# Patient Record
Sex: Male | Born: 1960 | Race: White | Hispanic: No | Marital: Married | Smoking: Former smoker
Health system: Southern US, Community
[De-identification: ages and names within clinical notes are randomized; demographics above are authoritative.]

## PROBLEM LIST (undated history)

## (undated) DIAGNOSIS — Z9289 Personal history of other medical treatment: Secondary | ICD-10-CM

## (undated) DIAGNOSIS — E7800 Pure hypercholesterolemia, unspecified: Secondary | ICD-10-CM

## (undated) DIAGNOSIS — I2510 Atherosclerotic heart disease of native coronary artery without angina pectoris: Secondary | ICD-10-CM

## (undated) DIAGNOSIS — I10 Essential (primary) hypertension: Secondary | ICD-10-CM

## (undated) HISTORY — DX: Personal history of other medical treatment: Z92.89

## (undated) HISTORY — DX: Atherosclerotic heart disease of native coronary artery without angina pectoris: I25.10

## (undated) HISTORY — PX: HERNIA REPAIR: SHX51

---

## 2010-11-22 LAB — BASIC METABOLIC PANEL
BUN: 12 mg/dL (ref 6–23)
Chloride: 101 mEq/L (ref 96–112)
Potassium: 4.6 mEq/L (ref 3.5–5.1)
Sodium: 141 mEq/L (ref 135–145)

## 2010-11-22 LAB — DIFFERENTIAL
Basophils Absolute: 0 10*3/uL (ref 0.0–0.1)
Eosinophils Absolute: 0.1 10*3/uL (ref 0.0–0.7)
Lymphocytes Relative: 27 % (ref 12–46)
Lymphs Abs: 1.9 10*3/uL (ref 0.7–4.0)
Neutrophils Relative %: 65 % (ref 43–77)

## 2010-11-22 LAB — CBC
HCT: 44.7 % (ref 39.0–52.0)
MCV: 91.8 fL (ref 78.0–100.0)
Platelets: 225 10*3/uL (ref 150–400)
RBC: 4.87 MIL/uL (ref 4.22–5.81)
WBC: 7 10*3/uL (ref 4.0–10.5)

## 2010-11-22 LAB — SURGICAL PCR SCREEN: Staphylococcus aureus: NEGATIVE

## 2010-11-30 ENCOUNTER — Ambulatory Visit (HOSPITAL_COMMUNITY)
Admission: RE | Admit: 2010-11-30 | Discharge: 2010-11-30 | Disposition: A | Discharge status: Home / Self Care | Payer: BC Managed Care – PPO | Attending: General Surgery | Admitting: General Surgery

## 2010-11-30 DIAGNOSIS — K4020 Bilateral inguinal hernia, without obstruction or gangrene, not specified as recurrent: Secondary | ICD-10-CM | POA: Insufficient documentation

## 2010-12-18 NOTE — Op Note (Signed)
Donald Torres, Donald Torres               ACCOUNT NO.:  [id number]  MEDICAL RECORD NO.:  [id number]           PATIENT TYPE:  O  LOCATION:  DAYL                         FACILITY:  [facility]  PHYSICIAN:  Mary Sella. Andrey Campanile, MD     DATE OF BIRTH:  1960/12/17  DATE OF PROCEDURE:  11/30/2010 DATE OF DISCHARGE:                              OPERATIVE REPORT   PREOPERATIVE DIAGNOSIS:  Bilateral inguinal hernias.  POSTOPERATIVE DIAGNOSIS:  Bilateral direct inguinal hernias.  PROCEDURE:  Laparoscopic repair of bilateral direct inguinal hernias with mesh (transabdominal pre-peritoneal).  SURGEON:  Mary Sella.  Andrey Campanile, M.D.  ASSISTANT SURGEON:  None.  ANESTHESIA:  General plus 30 cc of 0.25% Marcaine with epinephrine.  FINDINGS:  The patient had bilateral direct inguinal hernias.  I used two pieces of Physiomesh approximately 10 cm x 15 cm, one for each groin, to repair the defect.  ESTIMATED BLOOD LOSS:  Minimal.  INDICATIONS FOR PROCEDURE:  The patient is a very pleasant 50 year old Caucasian male that has noticed bulges in his groin for some years. However, over the past 6 months they have gotten a little bit larger. He is also now having some intermittent discomfort depending on what activity he is doing.  He reports a dull ache in his groin and occasionally will have a sharp tightness in his groin.  He has elected to have repair.  We discussed the risks and benefits of surgery including bleeding, infection, injury to surrounding structures, testicular loss, chronic inguinal pain, need to convert to an open procedure, hernia recurrence, DVT occurrence and urinary retention.  We also talked about the typical postoperative recovery course.  All of his questions were asked and answered and he elected to proceed to surgery.  DESCRIPTION OF PROCEDURE:  After obtaining informed consent, the patient was brought back to the operating room and placed supine on the operating room table.  General  endotracheal anesthesia was established. Sequential compression devices were placed.  The Foley catheter was placed under sterile conditions.  His abdomen was prepped and draped in the usual standard surgical fashion.  Local was infiltrated at the base of the umbilicus.  Next, a 1-inch infraumbilical vertical incision was made with a #11 blade.  The fascia was grasped x2 and lifted anteriorly. Next, the fascia was incised with a #11 blade and the abdominal cavity was entered.  A pursestring suture consisting of 0 Vicryl on a UR6 needle was placed around the fascial edges.  The Hasson trocar was then placed under direct visualization into the abdominal cavity. Pneumoperitoneum was smoothly established up to a patient pressure of 15 mmHg.  The laparoscope was advanced.  I then placed him in Trendelenburg position.  Both groins were inspected.  There was no defect lateral to the inferior epigastric vessels.  However, he did have defects medial to both inferior epigastric vessels resulting in direct bilateral inguinal hernias.  I placed two 5-mm trocars, one in the left and one in the right midclavicular line, slightly above the level of the umbilicus under direct visualization after a local had been infiltrated.  I started my repair on the right side  first.  I grasped the peritoneum and then made a lazy S incision with electrocautery, taking care not to injure the inferior epigastric vessels.  I then gently pulled the peritoneal flap downward toward the patient's back.  Medially I identified the pubic tubercle.  I then continued to take down the flap. The inferior epigastric vessel location was again confirmed.  I then reduced the direct defect in its entirety.  I then stripped and created a large pocket by retracting the peritoneal flap backward inferior and superior.  I identified the vas deferens as well as the testicular vessels.  I made a large pocket.  At this point, I obtained a piece  of Physiomesh 10 cm x 14 cm, rolled it like a cigar and placed it through the Hasson trocar and rolled it into the right groin.  Prior to tacking the mesh into place, I grasped some of the peritoneum in the direct defect and tacked it into Cooper's ligament to help reduce the formation of seroma.  I then laid the mesh out so that it covered both the direct defect as well as lateral to the inferior epigastric vessels.  Both sites were well covered.  I then tacked the mesh to the Cooper's ligament with a secure strap tacker.  It was then tacked anterior and superior to the abdominal wall using one finger on the outside of the abdominal cavity as counter pressure.  One tack was placed on each side of the inferior epigastric vessels and one tack laterally.  The camera was then placed on the left 5-mm trocar and then I performed the same procedure on the left side.  Again a lazy S incision was made with the Endoshears starting laterally and going medially towards the median umbilical ligament.  I then dissected the flap downward creating a nice pocket.  Again, he did not have a defect in the indirect space but there was a little bit of give in that area but there was no hernia sac on that side.  He again had a large defect on the left.  I reduced the direct defect in its entirety.  I then created a nice pocket again to accommodate the piece of Physiomesh.  The inferior epigastric vessels, the vas deferens as well as the testicular vessels were again identified and confirmed.  A large Physiomesh was rolled like a cigar and placed into the Hasson trocar and then funneled into the left groin.  It was uncurled and laid flush against the abdominal wall, covering both the direct defect as well as lateral to the inferior epigastric vessels. This was again secured in a similar fashion.  Again, I tacked the peritoneum of the direct defect to Cooper's ligament to try to reduce the chance of seroma  formation.  The mesh was secured to the abdominal wall in a similar fashion that it had been on the right.  No tacks were placed below the Cooper's ligament or below the shelving edge.  The mesh was well seated.  I then reduced the intra-abdominal pressure to 8 mmHg. I then tacked the peritoneal flap back up to the anterior abdominal wall, again taking great care as to not place a tack through the inferior epigastric vessels.  Both pieces of mesh were well covered. There was no exposed mesh.  I then removed the Hasson trocar.  I tied down the previously placed pursestring suture.  There was a small gap. Therefore, I placed a single interrupted 0 Vicryl stitch again.  The fascial defect of the umbilicus was now well approximated.  There was no signs of air leak.  Pneumoperitoneum was released and the skin incisions were closed with 4-0 Monocryl in the subcuticular fashion followed by the application of Dermabond.  The Foley catheter was removed.  The patient's was extubated and taken to the recovery room in stable condition.  There were no immediate complications.  All needle, instrument and sponge counts were correct x2.  The patient tolerated the procedure well.     Mary Sella. Andrey Campanile, MD     EMW/MEDQ  D:  11/30/2010  T:  11/30/2010  Job:  [id number]  cc:   Dario Guardian, M.D. Fax: [phone]  Electronically Signed by Gaynelle Adu M.D. on 12/18/2010 08:25:44 AM

## 2014-07-23 ENCOUNTER — Inpatient Hospital Stay (HOSPITAL_COMMUNITY)
Admission: EM | Admit: 2014-07-23 | Discharge: 2014-07-27 | DRG: 247 | Disposition: A | Discharge status: Home / Self Care | Payer: BC Managed Care – PPO | Attending: Cardiovascular Disease | Admitting: Cardiovascular Disease

## 2014-07-23 ENCOUNTER — Encounter (HOSPITAL_COMMUNITY): Payer: Self-pay | Admitting: Emergency Medicine

## 2014-07-23 ENCOUNTER — Emergency Department (HOSPITAL_COMMUNITY): Payer: BC Managed Care – PPO

## 2014-07-23 DIAGNOSIS — I2510 Atherosclerotic heart disease of native coronary artery without angina pectoris: Secondary | ICD-10-CM | POA: Diagnosis present

## 2014-07-23 DIAGNOSIS — Z9861 Coronary angioplasty status: Secondary | ICD-10-CM

## 2014-07-23 DIAGNOSIS — Z79899 Other long term (current) drug therapy: Secondary | ICD-10-CM

## 2014-07-23 DIAGNOSIS — E7800 Pure hypercholesterolemia, unspecified: Secondary | ICD-10-CM | POA: Diagnosis present

## 2014-07-23 DIAGNOSIS — Z7982 Long term (current) use of aspirin: Secondary | ICD-10-CM

## 2014-07-23 DIAGNOSIS — I10 Essential (primary) hypertension: Secondary | ICD-10-CM

## 2014-07-23 DIAGNOSIS — R079 Chest pain, unspecified: Secondary | ICD-10-CM

## 2014-07-23 DIAGNOSIS — Z720 Tobacco use: Secondary | ICD-10-CM

## 2014-07-23 DIAGNOSIS — F17200 Nicotine dependence, unspecified, uncomplicated: Secondary | ICD-10-CM | POA: Diagnosis present

## 2014-07-23 DIAGNOSIS — I214 Non-ST elevation (NSTEMI) myocardial infarction: Secondary | ICD-10-CM | POA: Diagnosis not present

## 2014-07-23 DIAGNOSIS — E785 Hyperlipidemia, unspecified: Secondary | ICD-10-CM | POA: Diagnosis present

## 2014-07-23 DIAGNOSIS — Z7902 Long term (current) use of antithrombotics/antiplatelets: Secondary | ICD-10-CM

## 2014-07-23 HISTORY — DX: Pure hypercholesterolemia, unspecified: E78.00

## 2014-07-23 HISTORY — DX: Essential (primary) hypertension: I10

## 2014-07-23 LAB — BASIC METABOLIC PANEL
Anion gap: 13 (ref 5–15)
BUN: 10 mg/dL (ref 6–23)
CALCIUM: 9.5 mg/dL (ref 8.4–10.5)
CO2: 25 mEq/L (ref 19–32)
CREATININE: 0.79 mg/dL (ref 0.50–1.35)
Chloride: 101 mEq/L (ref 96–112)
Glucose, Bld: 94 mg/dL (ref 70–99)
Potassium: 3.9 mEq/L (ref 3.7–5.3)
Sodium: 139 mEq/L (ref 137–147)

## 2014-07-23 LAB — TROPONIN I: Troponin I: 1.08 ng/mL (ref <0.30)

## 2014-07-23 LAB — CBC
HEMATOCRIT: 44.1 % (ref 39.0–52.0)
Hemoglobin: 15.4 g/dL (ref 13.0–17.0)
MCH: 31.5 pg (ref 26.0–34.0)
MCHC: 34.9 g/dL (ref 30.0–36.0)
MCV: 90.2 fL (ref 78.0–100.0)
Platelets: 240 10*3/uL (ref 150–400)
RBC: 4.89 MIL/uL (ref 4.22–5.81)
RDW: 13.4 % (ref 11.5–15.5)
WBC: 8.4 10*3/uL (ref 4.0–10.5)

## 2014-07-23 LAB — I-STAT TROPONIN, ED: Troponin i, poc: 0.58 ng/mL (ref 0.00–0.08)

## 2014-07-23 MED ORDER — ENOXAPARIN SODIUM 100 MG/ML ~~LOC~~ SOLN
90.0000 mg | Freq: Two times a day (BID) | SUBCUTANEOUS | Status: DC
  Administered 2014-07-23: 90 mg via SUBCUTANEOUS
  Filled 2014-07-23 (×5): qty 1

## 2014-07-23 MED ORDER — NITROGLYCERIN 0.4 MG/SPRAY TL SOLN: 1.0000 | Status: DC | PRN

## 2014-07-23 MED ORDER — OMEGA-3-ACID ETHYL ESTERS 1 G PO CAPS
1.0000 g | ORAL_CAPSULE | Freq: Every day | ORAL | Status: DC
Start: 2014-07-24 — End: 2014-07-27
  Administered 2014-07-24 – 2014-07-27 (×4): 1 g via ORAL
  Filled 2014-07-23 (×4): qty 1

## 2014-07-23 MED ORDER — METOPROLOL TARTRATE 12.5 MG HALF TABLET
12.5000 mg | ORAL_TABLET | Freq: Two times a day (BID) | ORAL | Status: DC
  Administered 2014-07-23 – 2014-07-26 (×5): 12.5 mg via ORAL
  Filled 2014-07-23 (×7): qty 1

## 2014-07-23 MED ORDER — ACETAMINOPHEN 325 MG PO TABS: 650.0000 mg | ORAL_TABLET | ORAL | Status: DC | PRN

## 2014-07-23 MED ORDER — ASPIRIN EC 81 MG PO TBEC
81.0000 mg | DELAYED_RELEASE_TABLET | Freq: Every day | ORAL | Status: DC
  Administered 2014-07-24 – 2014-07-25 (×2): 81 mg via ORAL
  Filled 2014-07-23 (×2): qty 1

## 2014-07-23 MED ORDER — IRBESARTAN 150 MG PO TABS
150.0000 mg | ORAL_TABLET | Freq: Every day | ORAL | Status: DC
  Administered 2014-07-24 – 2014-07-27 (×4): 150 mg via ORAL
  Filled 2014-07-23 (×4): qty 1

## 2014-07-23 MED ORDER — NITROGLYCERIN 0.4 MG SL SUBL: 0.4000 mg | SUBLINGUAL_TABLET | SUBLINGUAL | Status: DC | PRN

## 2014-07-23 MED ORDER — SODIUM CHLORIDE 0.9 % IV SOLN
INTRAVENOUS | Status: AC
  Administered 2014-07-23: 23:00:00 via INTRAVENOUS

## 2014-07-23 MED ORDER — NITROGLYCERIN 0.4 MG SL SUBL
0.4000 mg | SUBLINGUAL_TABLET | SUBLINGUAL | Status: DC | PRN
  Administered 2014-07-23 (×3): 0.4 mg via SUBLINGUAL
  Filled 2014-07-23: qty 1

## 2014-07-23 MED ORDER — ATORVASTATIN CALCIUM 80 MG PO TABS
80.0000 mg | ORAL_TABLET | Freq: Every day | ORAL | Status: DC
  Administered 2014-07-24 – 2014-07-25 (×2): 80 mg via ORAL
  Filled 2014-07-23 (×4): qty 1

## 2014-07-23 MED ORDER — ONDANSETRON HCL 4 MG/2ML IJ SOLN: 4.0000 mg | Freq: Four times a day (QID) | INTRAMUSCULAR | Status: DC | PRN

## 2014-07-23 NOTE — Progress Notes (Signed)
ANTICOAGULATION CONSULT NOTE - Initial Consult  Pharmacy Consult for lovenox Indication: chest pain/ACS  No Known Allergies  Patient Measurements: Height: 6' (182.9 cm) Weight: 195 lb (88.451 kg) IBW/kg (Calculated) : 77.6   Vital Signs: Temp: 97.6 F (36.4 C) (09/25 2053) Temp src: Oral (09/25 2053) BP: 123/64 mmHg (09/25 2053) Pulse Rate: 75 (09/25 2053)  Labs:  Recent Labs  07/23/14 1851  HGB 15.4  HCT 44.1  PLT 240  CREATININE 0.79  TROPONINI 1.08*    Estimated Creatinine Clearance: 119.9 ml/min (by C-G formula based on Cr of 0.79).   Medical History: Past Medical History  Diagnosis Date  . Hypertension   . High cholesterol     Assessment: 44 YOM with hx of HTN, and HLD presented with chest pain with troponin elevation. Pharmacy is consulted to start full dose lovenox for ACS. CBC wnl, no anticoagulation prior to admission. Scr 0.79, est. crcl > 100 ml/min  Goal of Therapy:  Anti-Xa level 0.6-1 units/ml 4hrs after LMWH dose given Monitor platelets by anticoagulation protocol: Yes   Plan:  - Lovenox 90 mg sq Q 12 hrs - Monitor cbc and renal function  Maryanna Shape, PharmD, BCPS  Clinical Pharmacist  Pager: [phone]   07/23/2014,8:59 PM

## 2014-07-23 NOTE — ED Notes (Signed)
EKG completed and given to ED Doctor at bedside.

## 2014-07-23 NOTE — ED Provider Notes (Signed)
I saw and evaluated the patient, reviewed the resident's note and I agree with the findings and plan.   .Face to face Exam:  General:  Awake HEENT:  Atraumatic Resp:  Normal effort Abd:  Nondistended Neuro:No focal weakness Lymph: No adenopathy  EKG was discussed and reviewed with the resident  CRITICAL CARE Performed by: Leonard Schwartz L Total critical care time: 30 min Critical care time was exclusive of separately billable procedures and treating other patients. Critical care was necessary to treat or prevent imminent or life-threatening deterioration. Critical care was time spent personally by me on the following activities: development of treatment plan with patient and/or surrogate as well as nursing, discussions with consultants, evaluation of patient's response to treatment, examination of patient, obtaining history from patient or surrogate, ordering and performing treatments and interventions, ordering and review of laboratory studies, ordering and review of radiographic studies, pulse oximetry and re-evaluation of patient's condition.   Dot Lanes, MD 07/23/14 2352

## 2014-07-23 NOTE — ED Notes (Signed)
Cardiology at bedside.

## 2014-07-23 NOTE — ED Notes (Signed)
Elevated i-stat trop shown to Dr. Sunny Schlein

## 2014-07-23 NOTE — ED Notes (Signed)
MD at bedside. 

## 2014-07-23 NOTE — ED Notes (Signed)
Lab called with critical value troponin 1.08. MD notified

## 2014-07-23 NOTE — ED Notes (Signed)
Had substernal aching cp this morning that has resolved. Now, chest feels tight.  Also, feels weak.

## 2014-07-23 NOTE — H&P (Signed)
Cardiology History and Physical  PCP: Mathews Argyle, MD Cardiologist: None  History of Present Illness (and review of medical records): Donald Torres is a 53 y.o. male who presents for evaluation of chest pain.  He was working on his car this am changing tires when he developed chest pain.  Pain was described as aching/pressure like sensation.  He rated pressure 5/10 and rated across chest and to both arms bilaterally.  This was around 10am.  He took his am meds and an ASA.  He called his wife who works in 46 office as pressure did not go away after resting for a few minutes. He has had chest pain before but was concerned since it never last long. He was seen around noon.  He had lab work and ekg done.  He still felt drained and mild pressure at this time.  He was called later on around 5pm with abnormal troponins.  He thus presented to the ED for further evaluation.  He was given 2 Nitro in the ED and pressure was 1/10.  His troponins are 0.58, 1.08.    Previous diagnostic testing for coronary artery disease includes: none. Previous history of cardiac disease includes None.  Coronary artery disease risk factors include: dyslipidemia, hypertension, male gender and smoking/ tobacco exposure.  Patient denies history of CABG, cardiomyopathy, coronary angioplasty, coronary artery stent, ischemic heart disease, pericarditis, previous M.I. and valvular disease.  Review of Systems Further review of systems was otherwise negative other than stated in HPI.  Patient Active Problem List   Diagnosis Date Noted  . NSTEMI (non-ST elevated myocardial infarction) 07/23/2014  . HTN (hypertension), benign 07/23/2014  . Dyslipidemia 07/23/2014  . Tobacco abuse 07/23/2014   Past Medical History  Diagnosis Date  . Hypertension   . High cholesterol     Past Surgical History  Procedure Laterality Date  . Hernia repair       (Not in a hospital admission) No Known Allergies  History   Substance Use Topics  . Smoking status: Current Every Day Smoker  . Smokeless tobacco: Not on file  . Alcohol Use: Yes    No family history on file.   Objective:  Patient Vitals for the past 8 hrs:  BP Temp Temp src Pulse Resp SpO2 Height Weight  07/23/14 2115 123/68 mmHg - - 78 14 96 % - -  07/23/14 2100 123/73 mmHg - - 76 14 97 % - -  07/23/14 2053 123/64 mmHg 97.6 F (36.4 C) Oral 75 18 98 % - -  07/23/14 2045 130/68 mmHg - - 78 11 98 % - -  07/23/14 2035 124/78 mmHg - - 81 16 95 % - -  07/23/14 2030 136/83 mmHg - - 66 14 97 % - -  07/23/14 2000 124/79 mmHg - - 77 16 96 % - -  07/23/14 1957 - - - 69 13 96 % - -  07/23/14 1945 136/78 mmHg - - 78 16 96 % - -  07/23/14 1928 138/82 mmHg - - 77 24 99 % - -  07/23/14 1846 139/74 mmHg 98.1 F (36.7 C) Oral 79 12 97 % 6' (1.829 m) 88.451 kg (195 lb)   General appearance: alert, cooperative, appears stated age and no distress Head: Normocephalic, without obvious abnormality, atraumatic Eyes: conjunctivae/corneas clear. PERRL, EOM's intact. Neck: no carotid bruit, no JVD and supple, Lungs: clear to auscultation bilaterally Chest wall: no tenderness Heart: regular rate and rhythm, S1, S2 normal, no murmur, click, rub or  gallop Abdomen: soft, non-tender; bowel sounds normal Extremities: extremities normal, atraumatic, no cyanosis or edema Pulses: 2+ and symmetric Neurologic: Grossly normal  Results for orders placed during the hospital encounter of 07/23/14 (from the past 48 hour(s))  CBC     Status: None   Collection Time    07/23/14  6:51 PM      Result Value Ref Range   WBC 8.4  4.0 - 10.5 K/uL   RBC 4.89  4.22 - 5.81 MIL/uL   Hemoglobin 15.4  13.0 - 17.0 g/dL   HCT 44.1  39.0 - 52.0 %   MCV 90.2  78.0 - 100.0 fL   MCH 31.5  26.0 - 34.0 pg   MCHC 34.9  30.0 - 36.0 g/dL   RDW 13.4  11.5 - 15.5 %   Platelets 240  150 - 400 K/uL  BASIC METABOLIC PANEL     Status: None   Collection Time    07/23/14  6:51 PM      Result  Value Ref Range   Sodium 139  137 - 147 mEq/L   Potassium 3.9  3.7 - 5.3 mEq/L   Chloride 101  96 - 112 mEq/L   CO2 25  19 - 32 mEq/L   Glucose, Bld 94  70 - 99 mg/dL   BUN 10  6 - 23 mg/dL   Creatinine, Ser 0.79  0.50 - 1.35 mg/dL   Calcium 9.5  8.4 - 10.5 mg/dL   GFR calc non Af Amer >90  >90 mL/min   GFR calc Af Amer >90  >90 mL/min   Comment: (NOTE)     The eGFR has been calculated using the CKD EPI equation.     This calculation has not been validated in all clinical situations.     eGFR's persistently <90 mL/min signify possible Chronic Kidney     Disease.   Anion gap 13  5 - 15  TROPONIN I     Status: Abnormal   Collection Time    07/23/14  6:51 PM      Result Value Ref Range   Troponin I 1.08 (*) <0.30 ng/mL   Comment:            Due to the release kinetics of cTnI,     a negative result within the first hours     of the onset of symptoms does not rule out     myocardial infarction with certainty.     If myocardial infarction is still suspected,     repeat the test at appropriate intervals.     CRITICAL RESULT CALLED TO, READ BACK BY AND VERIFIED WITH:     Loretta Plume [id number] 7/35/32 D BRADLEY  I-STAT TROPOININ, ED     Status: Abnormal   Collection Time    07/23/14  7:11 PM      Result Value Ref Range   Troponin i, poc 0.58 (*) 0.00 - 0.08 ng/mL   Comment NOTIFIED PHYSICIAN     Comment 3            Comment: Due to the release kinetics of cTnI,     a negative result within the first hours     of the onset of symptoms does not rule out     myocardial infarction with certainty.     If myocardial infarction is still suspected,     repeat the test at appropriate intervals.   Dg Chest 2 View  07/23/2014   CLINICAL DATA:  Shortness of breath and chest pain.  EXAM: CHEST  2 VIEW  COMPARISON:  11/21/2010  FINDINGS: Lungs are clear bilaterally. Heart and mediastinum are within normal limits. The trachea is midline. Negative for a pneumothorax. No acute bone abnormality.   IMPRESSION: No active cardiopulmonary disease.   Electronically Signed   By: Markus Daft M.D.   On: 07/23/2014 19:23    ECG:  9/25 1841 sinus rhythm, cannot exclude inferior infarct, no prior ecgs to compare.  9/25 2024 sinus rhythm, probably inferior infarct.  Assessment: 50M with hyx of HTN, dyslipidemia, tobacco abuse presents with chest pain, abnormal ecg and positive troponins suggestive of ACS/NSTEMI.  Plan: 1. Cardiology Admission  2. Continuous monitoring on Telemetry. 3. Repeat ekg on admit, prn chest pain or arrythmia 4. Trend cardiac biomarkers, check lipids, hgba1c, tsh 5. Medical management to include ASA, Lovenox Q12H, BB, ARB, Statin, NTG prn 6. TTE in am assess LV function, wall motion, and valve disorders 7. Gentle IVFs 8. Will likely need further invasive ischemic evaluation with cardiac catheterization.

## 2014-07-23 NOTE — ED Provider Notes (Signed)
CSN: [id number]     Arrival date & time 07/23/14  1837 History   First MD Initiated Contact with Patient 07/23/14 1917     Chief Complaint  Patient presents with  . Chest Pain  . Shortness of Breath     (Consider location/radiation/quality/duration/timing/severity/associated sxs/prior Treatment) HPI Comments: No hx of recent travel/surgery/hormones/trauma/hemoptysis/fam hx of PE/DVT   Patient is a 53 y.o. male presenting with chest pain. The history is provided by the patient.  Chest Pain Chest pain location: center chest. Pain quality: dull and pressure   Pain radiates to:  L arm and R arm Pain radiates to the back: no   Pain severity:  Mild Onset quality:  Gradual (states started just after fixing tires on his car) Duration:  8 hours Timing:  Constant Progression:  Partially resolved Chronicity:  New Context: at rest   Context comment:  Shortly after rotating tires on car Relieved by:  Aspirin (325 ASA helped) Worsened by:  Nothing tried Ineffective treatments:  None tried Associated symptoms: nausea and shortness of breath   Associated symptoms: no abdominal pain, no claudication, no cough, no diaphoresis, no dizziness, no fever, no lower extremity edema, no numbness, no orthopnea, no syncope, not vomiting and no weakness   Risk factors: high cholesterol, hypertension, male sex and smoking   Risk factors: no birth control, no coronary artery disease, no diabetes mellitus, no immobilization, not obese and no prior DVT/PE     Past Medical History  Diagnosis Date  . Hypertension   . High cholesterol    Past Surgical History  Procedure Laterality Date  . Hernia repair     History reviewed. No pertinent family history. History  Substance Use Topics  . Smoking status: Current Every Day Smoker -- 1.00 packs/day for 36 years  . Smokeless tobacco: Not on file  . Alcohol Use: Yes    Review of Systems  Constitutional: Negative for fever, diaphoresis, activity change and  appetite change.  HENT: Negative for congestion and rhinorrhea.   Eyes: Negative for discharge and itching.  Respiratory: Positive for shortness of breath. Negative for cough and wheezing.   Cardiovascular: Positive for chest pain. Negative for orthopnea, claudication and syncope.  Gastrointestinal: Positive for nausea. Negative for vomiting, abdominal pain, diarrhea and constipation.  Genitourinary: Negative for hematuria, decreased urine volume and difficulty urinating.  Skin: Negative for rash and wound.  Neurological: Negative for dizziness, syncope, weakness and numbness.  All other systems reviewed and are negative.     Allergies  Review of patient's allergies indicates no known allergies.  Home Medications   Prior to Admission medications   Medication Sig Start Date End Date Taking? Authorizing Provider  ALPRAZolam Duanne Moron) 0.5 MG tablet Take 1 tablet by mouth daily as needed. For anxiety 07/12/14  Yes Historical Provider, MD  amLODipine (NORVASC) 2.5 MG tablet Take 2.5 mg by mouth daily.   Yes Historical Provider, MD  aspirin EC 81 MG tablet Take 81 mg by mouth daily.   Yes Historical Provider, MD  NITROSTAT 0.4 MG SL tablet Place 1 tablet under the tongue every 5 (five) minutes x 3 doses as needed. For chest pain 07/23/14  Yes Historical Provider, MD  Omega-3 Fatty Acids (FISH OIL PO) Take 1 capsule by mouth daily.   Yes Historical Provider, MD  TRIBENZOR 40-5-12.5 MG TABS Take 1 tablet by mouth daily. 06/25/14  Yes Historical Provider, MD   BP 114/61  Pulse 81  Temp(Src) 98.2 F (36.8 C) (Oral)  Resp 14  Ht 6' (1.829 m)  Wt 195 lb (88.451 kg)  BMI 26.44 kg/m2  SpO2 96% Physical Exam  Vitals reviewed. Constitutional: He is oriented to person, place, and time. He appears well-developed and well-nourished. No distress.  HENT:  Head: Normocephalic and atraumatic.  Mouth/Throat: Oropharynx is clear and moist. No oropharyngeal exudate.  Eyes: Conjunctivae and EOM are  normal. Pupils are equal, round, and reactive to light. Right eye exhibits no discharge. Left eye exhibits no discharge. No scleral icterus.  Neck: Normal range of motion. Neck supple.  Cardiovascular: Normal rate, regular rhythm, normal heart sounds and intact distal pulses.  Exam reveals no gallop and no friction rub.   No murmur heard. Pulmonary/Chest: Effort normal and breath sounds normal. No respiratory distress. He has no wheezes. He has no rales.  Abdominal: Soft. He exhibits no distension and no mass. There is no tenderness.  Musculoskeletal: Normal range of motion. He exhibits no edema (no leg swelling or calf ttp).  Neurological: He is alert and oriented to person, place, and time. No cranial nerve deficit. He exhibits normal muscle tone. Coordination normal.  Skin: Skin is warm. No rash noted. He is not diaphoretic.    ED Course  Procedures (including critical care time) Labs Review Labs Reviewed  TROPONIN I - Abnormal; Notable for the following:    Troponin I 1.08 (*)    All other components within normal limits  I-STAT TROPOININ, ED - Abnormal; Notable for the following:    Troponin i, poc 0.58 (*)    All other components within normal limits  MRSA PCR SCREENING  CBC  BASIC METABOLIC PANEL  TROPONIN I  TROPONIN I  TROPONIN I  HEMOGLOBIN E9F  BASIC METABOLIC PANEL  LIPID PANEL  CBC  PROTIME-INR    Imaging Review Dg Chest 2 View  07/23/2014   CLINICAL DATA:  Shortness of breath and chest pain.  EXAM: CHEST  2 VIEW  COMPARISON:  11/21/2010  FINDINGS: Lungs are clear bilaterally. Heart and mediastinum are within normal limits. The trachea is midline. Negative for a pneumothorax. No acute bone abnormality.  IMPRESSION: No active cardiopulmonary disease.   Electronically Signed   By: Markus Daft M.D.   On: 07/23/2014 19:23     EKG Interpretation   Date/Time:  Friday July 23 2014 18:41:11 EDT Ventricular Rate:  87 PR Interval:  158 QRS Duration: 94 QT  Interval:  356 QTC Calculation: 428 R Axis:   48 Text Interpretation:  Normal sinus rhythm Normal ECG Confirmed by BEATON   MD, ROBERT ([id number]) on 07/23/2014 7:41:37 PM      MDM   MDM: 53 y.o. WM w/ PMHx of HTN, HLD, smoker, fam hx of ACS w/ cc: of chest pain. Occurred after fixing tires on car. SOB as well. Took ASA at home. Got better but still heavy. Here AFVSS. No PE risk factors outside of age, and not c/w PE. Pt well appearing. EKG with no ischemic chagne. Trop 1. Concern for ACS. NTG given. D/w cards who recommend Lovenox. Lovenox given. Admit to Cards. Pt remained HDS while in ED. Admit.  Final diagnoses:  NSTEMI (non-ST elevated myocardial infarction)    Admit to Cards   Sol Passer, MD 07/23/14 2322

## 2014-07-24 DIAGNOSIS — I517 Cardiomegaly: Secondary | ICD-10-CM

## 2014-07-24 LAB — CBC
HEMATOCRIT: 42.6 % (ref 39.0–52.0)
Hemoglobin: 14.3 g/dL (ref 13.0–17.0)
MCH: 31.4 pg (ref 26.0–34.0)
MCHC: 33.6 g/dL (ref 30.0–36.0)
MCV: 93.6 fL (ref 78.0–100.0)
Platelets: 207 10*3/uL (ref 150–400)
RBC: 4.55 MIL/uL (ref 4.22–5.81)
RDW: 13.6 % (ref 11.5–15.5)
WBC: 7.5 10*3/uL (ref 4.0–10.5)

## 2014-07-24 LAB — PROTIME-INR
INR: 1.08 (ref 0.00–1.49)
PROTHROMBIN TIME: 14 s (ref 11.6–15.2)

## 2014-07-24 LAB — TROPONIN I
TROPONIN I: 1.37 ng/mL — AB (ref <0.30)
Troponin I: 1.25 ng/mL (ref <0.30)
Troponin I: 0.56 ng/mL (ref <0.30)

## 2014-07-24 LAB — BASIC METABOLIC PANEL
Anion gap: 11 (ref 5–15)
BUN: 11 mg/dL (ref 6–23)
CO2: 28 meq/L (ref 19–32)
CREATININE: 0.98 mg/dL (ref 0.50–1.35)
Calcium: 9 mg/dL (ref 8.4–10.5)
Chloride: 103 mEq/L (ref 96–112)
GFR calc non Af Amer: >90 mL/min (ref >90)
Glucose, Bld: 97 mg/dL (ref 70–99)
POTASSIUM: 4.4 meq/L (ref 3.7–5.3)
Sodium: 142 mEq/L (ref 137–147)

## 2014-07-24 LAB — LIPID PANEL
CHOL/HDL RATIO: 6.6 ratio
Cholesterol: 211 mg/dL — ABNORMAL HIGH (ref 0–200)
HDL: 32 mg/dL — AB (ref >39)
LDL Cholesterol: 140 mg/dL — ABNORMAL HIGH (ref 0–99)
TRIGLYCERIDES: 196 mg/dL — AB (ref <150)
VLDL: 39 mg/dL (ref 0–40)

## 2014-07-24 LAB — HEMOGLOBIN A1C
HEMOGLOBIN A1C: 6.3 % — AB (ref <5.7)
Mean Plasma Glucose: 134 mg/dL — ABNORMAL HIGH (ref <117)

## 2014-07-24 LAB — MRSA PCR SCREENING: MRSA by PCR: NEGATIVE

## 2014-07-24 MED ORDER — HEPARIN (PORCINE) IN NACL 100-0.45 UNIT/ML-% IJ SOLN
1000.0000 [IU]/h | INTRAMUSCULAR | Status: DC
  Administered 2014-07-24: 1000 [IU]/h via INTRAVENOUS
  Filled 2014-07-24 (×2): qty 250

## 2014-07-24 MED ORDER — ENOXAPARIN SODIUM 100 MG/ML ~~LOC~~ SOLN
85.0000 mg | Freq: Two times a day (BID) | SUBCUTANEOUS | Status: DC
  Administered 2014-07-24: 85 mg via SUBCUTANEOUS
  Filled 2014-07-24 (×3): qty 1

## 2014-07-24 NOTE — Progress Notes (Signed)
Subjective:  Events of night reviewed. No chest pain since admission.   Objective:  Vital Signs in the last 24 hours: BP 132/68  Pulse 84  Temp(Src) 98 F (36.7 C) (Oral)  Resp 15  Ht 6' (1.829 m)  Wt 85.4 kg (188 lb 4.4 oz)  BMI 25.53 kg/m2  SpO2 96%  Physical Exam: Pleasant WM in NAD Lungs:  Clear Cardiac:  Regular rhythm, normal S1 and S2, no S3 Extremities:  No edema present  Intake/Output from previous day: 09/25 0701 - 09/26 0700 In: 665 [P.O.:200; I.V.:465] Out: -   Weight Filed Weights   07/23/14 1846 07/23/14 2300  Weight: 88.451 kg (195 lb) 85.4 kg (188 lb 4.4 oz)    Lab Results: Basic Metabolic Panel:  Recent Labs  07/23/14 1851 07/24/14 0420  NA 139 142  K 3.9 4.4  CL 101 103  CO2 25 28  GLUCOSE 94 97  BUN 10 11  CREATININE 0.79 0.98   CBC:  Recent Labs  07/23/14 1851 07/24/14 0420  WBC 8.4 7.5  HGB 15.4 14.3  HCT 44.1 42.6  MCV 90.2 93.6  PLT 240 207   Cardiac Enzymes:  Recent Labs  07/23/14 2324 07/24/14 0420 07/24/14 1015  TROPONINI 1.37* 1.25* 0.56*    Telemetry: sinus  Assessment/Plan:  1. Non STEMI clinically 2. Hypertension 3. Tobacco abuse advised to stop  Plan:  IV heparin. Repeat EKG.  Will need cardiac cath Monday.  Sooner if becomes unstable.   Kerry Hough  MD [facility]  07/24/2014, 12:46 PM

## 2014-07-24 NOTE — Progress Notes (Signed)
  Echocardiogram 2D Echocardiogram has been performed.  Donald Torres 07/24/2014, 10:19 AM

## 2014-07-24 NOTE — Progress Notes (Signed)
ANTICOAGULATION CONSULT NOTE - Follow Up Consult  Pharmacy Consult for Lovenox >> Heparin Indication: chest pain/ACS  No Known Allergies  Patient Measurements: Height: 6' (182.9 cm) Weight: 188 lb 4.4 oz (85.4 kg) IBW/kg (Calculated) : 77.6 Heparin Dosing Weight: 85kg  Vital Signs: Temp: 98 F (36.7 C) (09/26 1206) Temp src: Oral (09/26 1206) BP: 132/68 mmHg (09/26 1206)  Labs:  Recent Labs  07/23/14 1851 07/23/14 2324 07/24/14 0420 07/24/14 1015  HGB 15.4  --  14.3  --   HCT 44.1  --  42.6  --   PLT 240  --  207  --   LABPROT  --   --  14.0  --   INR  --   --  1.08  --   CREATININE 0.79  --  0.98  --   TROPONINI 1.08* 1.37* 1.25* 0.56*    Estimated Creatinine Clearance: 97.9 ml/min (by C-G formula based on Cr of 0.98).  Assessment: 53yom started on full dose lovenox yesterday for chest pain (last dose 85mg  @ 0911 this morning). He will now be changed to IV heparin with plans for cath on Monday. Will delay heparin start until 2100 when next lovenox would have been due.  Goal of Therapy:  Heparin level 0.3-0.7 units/ml Monitor platelets by anticoagulation protocol: Yes   Plan:  1) D/C lovenox 2) At 2100, start heparin at 1000 units/hr 3) Check 6 hour heparin level 4) Daily heparin level and CBC  Deboraha Sprang 07/24/2014,2:59 PM

## 2014-07-25 DIAGNOSIS — I214 Non-ST elevation (NSTEMI) myocardial infarction: Secondary | ICD-10-CM

## 2014-07-25 LAB — CBC
HEMATOCRIT: 42.7 % (ref 39.0–52.0)
HEMOGLOBIN: 14.5 g/dL (ref 13.0–17.0)
MCH: 30.9 pg (ref 26.0–34.0)
MCHC: 34 g/dL (ref 30.0–36.0)
MCV: 91 fL (ref 78.0–100.0)
Platelets: 197 10*3/uL (ref 150–400)
RBC: 4.69 MIL/uL (ref 4.22–5.81)
RDW: 13.5 % (ref 11.5–15.5)
WBC: 8.5 10*3/uL (ref 4.0–10.5)

## 2014-07-25 LAB — HEPARIN LEVEL (UNFRACTIONATED)
Heparin Unfractionated: 0.36 IU/mL (ref 0.30–0.70)
Heparin Unfractionated: 0.12 IU/mL — ABNORMAL LOW (ref 0.30–0.70)
Heparin Unfractionated: 0.34 IU/mL (ref 0.30–0.70)

## 2014-07-25 MED ORDER — HEPARIN (PORCINE) IN NACL 100-0.45 UNIT/ML-% IJ SOLN
1300.0000 [IU]/h | INTRAMUSCULAR | Status: DC
  Administered 2014-07-25 – 2014-07-26 (×2): 1300 [IU]/h via INTRAVENOUS
  Filled 2014-07-25 (×4): qty 250

## 2014-07-25 MED ORDER — SODIUM CHLORIDE 0.9 % IJ SOLN
3.0000 mL | Freq: Two times a day (BID) | INTRAMUSCULAR | Status: DC
Start: 2014-07-25 — End: 2014-07-26
  Administered 2014-07-25: 3 mL via INTRAVENOUS

## 2014-07-25 MED ORDER — SODIUM CHLORIDE 0.9 % IV SOLN
1.0000 mL/kg/h | INTRAVENOUS | Status: DC
  Administered 2014-07-26: 1 mL/kg/h via INTRAVENOUS

## 2014-07-25 MED ORDER — ASPIRIN EC 81 MG PO TBEC: 81.0000 mg | DELAYED_RELEASE_TABLET | Freq: Every day | ORAL | Status: DC

## 2014-07-25 MED ORDER — SODIUM CHLORIDE 0.9 % IJ SOLN: 3.0000 mL | INTRAMUSCULAR | Status: DC | PRN

## 2014-07-25 MED ORDER — SODIUM CHLORIDE 0.9 % IV SOLN: 250.0000 mL | INTRAVENOUS | Status: DC | PRN

## 2014-07-25 MED ORDER — ASPIRIN 81 MG PO CHEW
81.0000 mg | CHEWABLE_TABLET | ORAL | Status: AC
  Administered 2014-07-26: 81 mg via ORAL
  Filled 2014-07-25: qty 1

## 2014-07-25 NOTE — Progress Notes (Signed)
Utilization Review Completed.   Kynesha Guerin, RN, BSN Nurse Case Manager  

## 2014-07-25 NOTE — Progress Notes (Signed)
       Patient Name: Donald Torres      SUBJECTIVE:no recurrent chest pain Are you a smoker " I was "  Donald Torres   Past Medical History  Diagnosis Date  . Hypertension   . High cholesterol     Scheduled Meds:  Scheduled Meds: . aspirin EC  81 mg Oral Daily  . atorvastatin  80 mg Oral q1800  . irbesartan  150 mg Oral Daily  . metoprolol tartrate  12.5 mg Oral BID  . omega-3 acid ethyl esters  1 g Oral Daily   Continuous Infusions: . heparin 1,000 Units/hr (07/25/14 0900)   acetaminophen, nitroGLYCERIN, ondansetron (ZOFRAN) IV    PHYSICAL EXAM Filed Vitals:   07/25/14 0500 07/25/14 0600 07/25/14 0700 07/25/14 0807  BP:    113/70  Pulse:    52  Temp:    97.6 F (36.4 C)  TempSrc:    Oral  Resp: 13 13 11 11   Height:      Weight:      SpO2:    96%   Well developed and nourished in no acute distress HENT normal Neck supple with JVP-flat Clear Regular rate and rhythm, no murmurs or gallops Abd-soft with active BS No Clubbing cyanosis edema Skin-warm and dry A & Oriented  Grossly normal sensory and motor function  TELEMETRY: Reviewed telemetry pt in nsr    Intake/Output Summary (Last 24 hours) at 07/25/14 1012 Last data filed at 07/25/14 0900  Gross per 24 hour  Intake 1257.5 ml  Output      0 ml  Net 1257.5 ml    LABS: Basic Metabolic Panel:  Recent Labs Lab 07/23/14 1851 07/24/14 0420  NA 139 142  K 3.9 4.4  CL 101 103  CO2 25 28  GLUCOSE 94 97  BUN 10 11  CREATININE 0.79 0.98  CALCIUM 9.5 9.0   Cardiac Enzymes:  Recent Labs  07/23/14 2324 07/24/14 0420 07/24/14 1015  TROPONINI 1.37* 1.25* 0.56*   CBC:  Recent Labs Lab 07/23/14 1851 07/24/14 0420 07/25/14 0256  WBC 8.4 7.5 8.5  HGB 15.4 14.3 14.5  HCT 44.1 42.6 42.7  MCV 90.2 93.6 91.0  PLT 240 207 197   PROTIME:  Recent Labs  07/24/14 0420  LABPROT 14.0  INR 1.08   Liver Function Tests: No results found for this basename: AST, ALT, ALKPHOS, BILITOT, PROT,  ALBUMIN,  in the last 72 hours No results found for this basename: LIPASE, AMYLASE,  in the last 72 hours BNP: BNP (last 3 results) No results found for this basename: PROBNP,  in the last 8760 hours D-Dimer: No results found for this basename: DDIMER,  in the last 72 hours Hemoglobin A1C:  Recent Labs  07/23/14 2324  HGBA1C 6.3*   Fasting Lipid Panel:  Recent Labs  07/24/14 0420  CHOL 211*  HDL 32*  LDLCALC 140*  TRIG 196*  CHOLHDL 6.6   Thyroid Function Tests:  ASSESSMENT AND PLAN:  Principal Problem:   NSTEMI (non-ST elevated myocardial infarction) Active Problems:   HTN (hypertension), benign   Dyslipidemia   Tobacco abuse  On IV heparin on for cath monday   Signed, Virl Axe MD  07/25/2014

## 2014-07-25 NOTE — Progress Notes (Signed)
ANTICOAGULATION CONSULT NOTE - Follow Up Consult  Pharmacy Consult for Heparin Indication: chest pain/ACS  No Known Allergies  Patient Measurements: Height: 6' (182.9 cm) Weight: 188 lb 4.4 oz (85.4 kg) IBW/kg (Calculated) : 77.6 Heparin Dosing Weight: 85kg  Vital Signs: Temp: 97.6 F (36.4 C) (09/27 0807) Temp src: Oral (09/27 0807) BP: 113/70 mmHg (09/27 0807) Pulse Rate: 52 (09/27 0807)  Labs:  Recent Labs  07/23/14 1851 07/23/14 2324 07/24/14 0420 07/24/14 1015 07/25/14 0256 07/25/14 0937  HGB 15.4  --  14.3  --  14.5  --   HCT 44.1  --  42.6  --  42.7  --   PLT 240  --  207  --  197  --   LABPROT  --   --  14.0  --   --   --   INR  --   --  1.08  --   --   --   HEPARINUNFRC  --   --   --   --  0.36 0.12*  CREATININE 0.79  --  0.98  --   --   --   TROPONINI 1.08* 1.37* 1.25* 0.56*  --   --     Estimated Creatinine Clearance: 97.9 ml/min (by C-G formula based on Cr of 0.98).  Medications: Heparin @ 1000 units/hr  Assessment: 53yom switched from full dose lovenox to IV heparin yesterday with plans for cath tomorrow. Initial heparin level was therapeutic, but likely reflected the lovenox that had been given yesterday morning. Follow up heparin level is below goal. CBC is stable. No bleeding reported.  Goal of Therapy:  Heparin level 0.3-0.7 units/ml Monitor platelets by anticoagulation protocol: Yes   Plan:  1) Increase heparin to 1300 units/hr 2) Check 6 hour heparin level  Deboraha Sprang 07/25/2014,10:52 AM

## 2014-07-25 NOTE — Progress Notes (Signed)
ANTICOAGULATION CONSULT NOTE - Follow Up Consult  Pharmacy Consult for heparin Indication: NSTEMI  Labs:  Recent Labs  07/23/14 1851 07/23/14 2324 07/24/14 0420 07/24/14 1015 07/25/14 0256  HGB 15.4  --  14.3  --  14.5  HCT 44.1  --  42.6  --  42.7  PLT 240  --  207  --  197  LABPROT  --   --  14.0  --   --   INR  --   --  1.08  --   --   HEPARINUNFRC  --   --   --   --  0.36  CREATININE 0.79  --  0.98  --   --   TROPONINI 1.08* 1.37* 1.25* 0.56*  --     Assessment/Plan:  53yo male therapeutic on heparin with initial dosing after transitioned from Lovenox. Will continue gtt at current rate and confirm stable with additional level.   Wynona Neat, PharmD, BCPS  07/25/2014,3:24 AM

## 2014-07-25 NOTE — Progress Notes (Signed)
ANTICOAGULATION CONSULT NOTE - Follow Up Consult  Pharmacy Consult for heparin Indication: chest pain/ACS  No Known Allergies  Patient Measurements: Height: 6' (182.9 cm) Weight: 188 lb 4.4 oz (85.4 kg) IBW/kg (Calculated) : 77.6 Heparin Dosing Weight: 85 kg  Vital Signs: Temp: 98 F (36.7 C) (09/27 1633) Temp src: Oral (09/27 1633) BP: 121/73 mmHg (09/27 1633) Pulse Rate: 54 (09/27 1633)  Labs:  Recent Labs  07/23/14 1851 07/23/14 2324 07/24/14 0420 07/24/14 1015 07/25/14 0256 07/25/14 0937 07/25/14 1638  HGB 15.4  --  14.3  --  14.5  --   --   HCT 44.1  --  42.6  --  42.7  --   --   PLT 240  --  207  --  197  --   --   LABPROT  --   --  14.0  --   --   --   --   INR  --   --  1.08  --   --   --   --   HEPARINUNFRC  --   --   --   --  0.36 0.12* 0.34  CREATININE 0.79  --  0.98  --   --   --   --   TROPONINI 1.08* 1.37* 1.25* 0.56*  --   --   --     Estimated Creatinine Clearance: 97.9 ml/min (by C-G formula based on Cr of 0.98).   Medications:  Scheduled:  . aspirin EC  81 mg Oral Daily  . atorvastatin  80 mg Oral q1800  . irbesartan  150 mg Oral Daily  . metoprolol tartrate  12.5 mg Oral BID  . omega-3 acid ethyl esters  1 g Oral Daily   Infusions:  . heparin 1,300 Units/hr (07/25/14 1700)    Assessment: 53 yo male with ACS is currently on therapeutic heparin.  Heparin level is 0.34. Goal of Therapy:  Heparin level 0.3-0.7 units/ml Monitor platelets by anticoagulation protocol: Yes   Plan:  - continue heparin drip at 1300 units/hr - heparin level in am  Jacalyn Biggs, Tsz-Yin 07/25/2014,5:44 PM

## 2014-07-26 ENCOUNTER — Encounter (HOSPITAL_COMMUNITY)
Admission: EM | Disposition: A | Discharge status: Home / Self Care | Payer: Self-pay | Source: Home / Self Care | Attending: Cardiovascular Disease

## 2014-07-26 DIAGNOSIS — F17200 Nicotine dependence, unspecified, uncomplicated: Secondary | ICD-10-CM

## 2014-07-26 DIAGNOSIS — I214 Non-ST elevation (NSTEMI) myocardial infarction: Principal | ICD-10-CM

## 2014-07-26 DIAGNOSIS — I10 Essential (primary) hypertension: Secondary | ICD-10-CM

## 2014-07-26 DIAGNOSIS — I2510 Atherosclerotic heart disease of native coronary artery without angina pectoris: Secondary | ICD-10-CM

## 2014-07-26 HISTORY — PX: LEFT HEART CATHETERIZATION WITH CORONARY ANGIOGRAM: SHX5451

## 2014-07-26 LAB — POCT ACTIVATED CLOTTING TIME
Activated Clotting Time: 326 seconds
Activated Clotting Time: 287 seconds

## 2014-07-26 LAB — CBC
HEMATOCRIT: 42 % (ref 39.0–52.0)
Hemoglobin: 14 g/dL (ref 13.0–17.0)
MCH: 31 pg (ref 26.0–34.0)
MCHC: 33.3 g/dL (ref 30.0–36.0)
MCV: 93.1 fL (ref 78.0–100.0)
Platelets: 198 10*3/uL (ref 150–400)
RBC: 4.51 MIL/uL (ref 4.22–5.81)
RDW: 13.5 % (ref 11.5–15.5)
WBC: 8.3 10*3/uL (ref 4.0–10.5)

## 2014-07-26 LAB — PROTIME-INR
INR: 1.01 (ref 0.00–1.49)
Prothrombin Time: 13.3 seconds (ref 11.6–15.2)

## 2014-07-26 LAB — BASIC METABOLIC PANEL
Anion gap: 11 (ref 5–15)
BUN: 12 mg/dL (ref 6–23)
CO2: 24 mEq/L (ref 19–32)
Calcium: 8.7 mg/dL (ref 8.4–10.5)
Chloride: 104 mEq/L (ref 96–112)
Creatinine, Ser: 0.86 mg/dL (ref 0.50–1.35)
Glucose, Bld: 93 mg/dL (ref 70–99)
Potassium: 4 mEq/L (ref 3.7–5.3)
Sodium: 139 mEq/L (ref 137–147)

## 2014-07-26 LAB — HEPARIN LEVEL (UNFRACTIONATED): HEPARIN UNFRACTIONATED: 0.39 [IU]/mL (ref 0.30–0.70)

## 2014-07-26 SURGERY — LEFT HEART CATHETERIZATION WITH CORONARY ANGIOGRAM
Anesthesia: LOCAL

## 2014-07-26 MED ORDER — HEPARIN (PORCINE) IN NACL 2-0.9 UNIT/ML-% IJ SOLN
INTRAMUSCULAR | Status: AC
  Filled 2014-07-26: qty 1000

## 2014-07-26 MED ORDER — SODIUM CHLORIDE 0.9 % IV SOLN: 1.0000 mL/kg/h | INTRAVENOUS | Status: AC

## 2014-07-26 MED ORDER — HEPARIN SODIUM (PORCINE) 1000 UNIT/ML IJ SOLN
INTRAMUSCULAR | Status: AC
  Filled 2014-07-26: qty 1

## 2014-07-26 MED ORDER — MIDAZOLAM HCL 2 MG/2ML IJ SOLN
INTRAMUSCULAR | Status: AC
  Filled 2014-07-26: qty 2

## 2014-07-26 MED ORDER — TIROFIBAN HCL IV 5 MG/100ML
0.1500 ug/kg/min | INTRAVENOUS | Status: AC
  Filled 2014-07-26: qty 100

## 2014-07-26 MED ORDER — METOPROLOL TARTRATE 12.5 MG HALF TABLET
12.5000 mg | ORAL_TABLET | Freq: Once | ORAL | Status: AC
  Administered 2014-07-26: 12.5 mg via ORAL
  Filled 2014-07-26: qty 1

## 2014-07-26 MED ORDER — TIROFIBAN HCL IV 5 MG/100ML
INTRAVENOUS | Status: AC
  Filled 2014-07-26: qty 100

## 2014-07-26 MED ORDER — TICAGRELOR 90 MG PO TABS
ORAL_TABLET | ORAL | Status: AC
  Filled 2014-07-26: qty 2

## 2014-07-26 MED ORDER — ASPIRIN 81 MG PO CHEW
81.0000 mg | CHEWABLE_TABLET | Freq: Every day | ORAL | Status: DC
  Administered 2014-07-27: 81 mg via ORAL
  Filled 2014-07-26: qty 1

## 2014-07-26 MED ORDER — ONDANSETRON HCL 4 MG/2ML IJ SOLN: 4.0000 mg | Freq: Four times a day (QID) | INTRAMUSCULAR | Status: DC | PRN

## 2014-07-26 MED ORDER — TICAGRELOR 90 MG PO TABS
90.0000 mg | ORAL_TABLET | Freq: Two times a day (BID) | ORAL | Status: DC
  Administered 2014-07-27: 90 mg via ORAL
  Filled 2014-07-26 (×3): qty 1

## 2014-07-26 MED ORDER — VERAPAMIL HCL 2.5 MG/ML IV SOLN
INTRAVENOUS | Status: AC
  Filled 2014-07-26: qty 2

## 2014-07-26 MED ORDER — HEPARIN SODIUM (PORCINE) 5000 UNIT/ML IJ SOLN
5000.0000 [IU] | Freq: Three times a day (TID) | INTRAMUSCULAR | Status: DC
  Administered 2014-07-27: 5000 [IU] via SUBCUTANEOUS
  Filled 2014-07-26 (×4): qty 1

## 2014-07-26 MED ORDER — NITROGLYCERIN 1 MG/10 ML FOR IR/CATH LAB
INTRA_ARTERIAL | Status: AC
  Filled 2014-07-26: qty 10

## 2014-07-26 MED ORDER — ACETAMINOPHEN 325 MG PO TABS: 650.0000 mg | ORAL_TABLET | ORAL | Status: DC | PRN

## 2014-07-26 MED ORDER — FENTANYL CITRATE 0.05 MG/ML IJ SOLN
INTRAMUSCULAR | Status: AC
  Filled 2014-07-26: qty 2

## 2014-07-26 MED ORDER — LIDOCAINE HCL (PF) 1 % IJ SOLN
INTRAMUSCULAR | Status: AC
  Filled 2014-07-26: qty 30

## 2014-07-26 MED ORDER — METOPROLOL TARTRATE 25 MG PO TABS
25.0000 mg | ORAL_TABLET | Freq: Two times a day (BID) | ORAL | Status: DC
  Administered 2014-07-26 – 2014-07-27 (×2): 25 mg via ORAL
  Filled 2014-07-26 (×3): qty 1

## 2014-07-26 NOTE — Progress Notes (Signed)
ANTICOAGULATION CONSULT NOTE - Follow Up Consult  Pharmacy Consult for heparin Indication: chest pain/ACS  No Known Allergies  Patient Measurements: Height: 6' (182.9 cm) Weight: 190 lb (86.183 kg) IBW/kg (Calculated) : 77.6 Heparin Dosing Weight: 85 kg  Vital Signs: Temp: 97.5 F (36.4 C) (09/28 0700) Temp src: Oral (09/28 0700) BP: 144/64 mmHg (09/28 0700) Pulse Rate: 60 (09/28 0300)  Labs:  Recent Labs  07/23/14 1851 07/23/14 2324 07/24/14 0420 07/24/14 1015  07/25/14 0256 07/25/14 0937 07/25/14 1638 07/26/14 0300  HGB 15.4  --  14.3  --   --  14.5  --   --  14.0  HCT 44.1  --  42.6  --   --  42.7  --   --  42.0  PLT 240  --  207  --   --  197  --   --  198  LABPROT  --   --  14.0  --   --   --   --   --  13.3  INR  --   --  1.08  --   --   --   --   --  1.01  HEPARINUNFRC  --   --   --   --   < > 0.36 0.12* 0.34 0.39  CREATININE 0.79  --  0.98  --   --   --   --   --  0.86  TROPONINI 1.08* 1.37* 1.25* 0.56*  --   --   --   --   --   < > = values in this interval not displayed.  Estimated Creatinine Clearance: 111.5 ml/min (by C-G formula based on Cr of 0.86).   Medications:  Scheduled:  . [START ON 07/27/2014] aspirin EC  81 mg Oral Daily  . atorvastatin  80 mg Oral q1800  . irbesartan  150 mg Oral Daily  . metoprolol tartrate  12.5 mg Oral BID  . omega-3 acid ethyl esters  1 g Oral Daily   Infusions:  . heparin 1,300 Units/hr (07/25/14 1700)    Assessment: 53 yo male with NSTEMI on therapeutic heparin (HL= 0.39).  Patient noted for cath today.  Goal of Therapy:  Heparin level 0.3-0.7 units/ml Monitor platelets by anticoagulation protocol: Yes   Plan:  - Continue heparin drip at 1300 units/hr - Will follow plans post cath  Hildred Laser, Pharm D 07/26/2014 10:17 AM

## 2014-07-26 NOTE — H&P (View-Only) (Signed)
    Subjective:  Denies CP or dyspnea   Objective:  Filed Vitals:   07/26/14 0400 07/26/14 0500 07/26/14 0600 07/26/14 0700  BP:    144/64  Pulse:      Temp:    97.5 F (36.4 C)  TempSrc:    Oral  Resp: 18 17 13 13   Height:      Weight:      SpO2:    98%    Intake/Output from previous day:  Intake/Output Summary (Last 24 hours) at 07/26/14 1039 Last data filed at 07/26/14 0700  Gross per 24 hour  Intake 1188.35 ml  Output      0 ml  Net 1188.35 ml    Physical Exam: Physical exam: Well-developed well-nourished in no acute distress.  Skin is warm and dry.  HEENT is normal.  Neck is supple.  Chest is clear to auscultation with normal expansion.  Cardiovascular exam is regular rate and rhythm.  Abdominal exam nontender or distended. No masses palpated. Extremities show no edema. neuro grossly intact    Lab Results: Basic Metabolic Panel:  Recent Labs  07/24/14 0420 07/26/14 0300  NA 142 139  K 4.4 4.0  CL 103 104  CO2 28 24  GLUCOSE 97 93  BUN 11 12  CREATININE 0.98 0.86  CALCIUM 9.0 8.7   CBC:  Recent Labs  07/25/14 0256 07/26/14 0300  WBC 8.5 8.3  HGB 14.5 14.0  HCT 42.7 42.0  MCV 91.0 93.1  PLT 197 198   Cardiac Enzymes:  Recent Labs  07/23/14 2324 07/24/14 0420 07/24/14 1015  TROPONINI 1.37* 1.25* 0.56*     Assessment/Plan:  1 non-ST elevation myocardial infarction-patient remains pain-free. Continue aspirin, statin, beta blocker and heparin. Proceed with cardiac catheterization today. The risks and benefits were discussed and the patient agrees to proceed. 2 tobacco abuse-patient counseled on discontinuing. 3 hypertension-continue present medications but increase metoprolol to 25 mg by mouth twice a day. 4 hyperlipidemia-continue statin.  Kirk Ruths 07/26/2014, 10:39 AM

## 2014-07-26 NOTE — Interval H&P Note (Signed)
Cath Lab Visit (complete for each Cath Lab visit)  Clinical Evaluation Leading to the Procedure:   ACS: Yes.    Non-ACS:    Anginal Classification: CCS IV  Anti-ischemic medical therapy: Minimal Therapy (1 class of medications)  Non-Invasive Test Results: No non-invasive testing performed  Prior CABG: No previous CABG      History and Physical Interval Note:  07/26/2014 4:46 PM  Donald Torres  has presented today for surgery, with the diagnosis of NSTEMI  The various methods of treatment have been discussed with the patient and family. After consideration of risks, benefits and other options for treatment, the patient has consented to  Procedure(s): LEFT HEART CATHETERIZATION WITH CORONARY ANGIOGRAM (N/A) as a surgical intervention .  The patient's history has been reviewed, patient examined, no change in status, stable for surgery.  I have reviewed the patient's chart and labs.  Questions were answered to the patient's satisfaction.     Ferne Ellingwood S.

## 2014-07-26 NOTE — CV Procedure (Addendum)
       PROCEDURE:  Left heart catheterization with selective coronary angiography, left ventriculogra PCI LAD; aspiration thrombectomy of the LAD.  INDICATIONS:  NSTEMI  The risks, benefits, and details of the procedure were explained to the patient.  The patient verbalized understanding and wanted to proceed.  Informed written consent was obtained.  PROCEDURE TECHNIQUE:  After Xylocaine anesthesia a 68F slender sheath was placed in the right radial artery with a single anterior needle wall stick.   IV heparin was given. Right coronary angiography was done using a Judkins R4 guide catheter.  Left coronary angiography was done using a Judkins L3.5 guide catheter.  Left ventriculography was done using a pigtail catheter.  The intervention was performed. Please see below for details. IV heparin was given. IV tirofiban was given. ACT was used to check that the anticoagulation was therapeutic. A TR band was used for hemostasis.   CONTRAST:  Total of 170 cc.  COMPLICATIONS:  None.    HEMODYNAMICS:  Aortic pressure was 99/59; LV pressure was 101/1; LVEDP 6.  There was no gradient between the left ventricle and aorta.    ANGIOGRAPHIC DATA:   The left main coronary artery is widely patent.  The left anterior descending artery is a large vessel which wraps around the apex. In the mid vessel, there is visible, mobile thrombus, up to 90% obstructive. After the thrombotic area, there is a medium-sized diagonal which is patent. The mid to distal LAD is widely patent.  The left circumflex artery is a large vessel which is widely patent. The first obtuse marginal is large and widely patent. There is mild atherosclerosis in the mid circumflex. There is a large ramus vessel which is widely patent. There is no significant obstructive disease.  The right coronary artery is a large dominant vessel with mild atherosclerosis .  LEFT VENTRICULOGRAM:  Left ventricular angiogram was done in the 30 RAO projection and  revealed normal left ventricular wall motion and systolic function with an estimated ejection fraction of 60 %.  LVEDP was 7 mmHg.  PCI NARRATIVE: A CLS 3.0 guiding catheters using his left main. A pro-water wire was placed across the area of thrombus and disease in the mid LAD.  A priority 1 aspiration catheter was advanced for several passes. There was removal of thrombus. Nitroglycerin  was given intracoronary.  A 2.75 x 28 Promus drug-eluting stent was deployed at 14 atmospheres. The stent was post dilated with a 3.0 x 20 noncompliant balloon to greater than 3 mm in diameter. TIMI-3 flow was maintained throughout. There was an excellent angiographic result.  IMPRESSIONS:  1. Normal left main coronary artery. 2. 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 This was successfully treated with aspiration thrombectomy followed by stenting with a 2.75 x 28 Promus drug-eluting stent, postdilated to greater than 3 mm in diameter. 3. Mild scattered plaque in the left circumflex artery and its branches. 4. Mild scattered plaque right coronary artery. 5. Normal left ventricular systolic function.  LVEDP 7 mmHg.  Ejection fraction 60%.  RECOMMENDATION:  Continue dual antiplatelet therapy for at least a year. We spoke extensively about smoking cessation. He'll need aggressive secondary prevention as well.  Anticipate discharge tomorrow.

## 2014-07-26 NOTE — Progress Notes (Signed)
    Subjective:  Denies CP or dyspnea   Objective:  Filed Vitals:   07/26/14 0400 07/26/14 0500 07/26/14 0600 07/26/14 0700  BP:    144/64  Pulse:      Temp:    97.5 F (36.4 C)  TempSrc:    Oral  Resp: 18 17 13 13   Height:      Weight:      SpO2:    98%    Intake/Output from previous day:  Intake/Output Summary (Last 24 hours) at 07/26/14 1039 Last data filed at 07/26/14 0700  Gross per 24 hour  Intake 1188.35 ml  Output      0 ml  Net 1188.35 ml    Physical Exam: Physical exam: Well-developed well-nourished in no acute distress.  Skin is warm and dry.  HEENT is normal.  Neck is supple.  Chest is clear to auscultation with normal expansion.  Cardiovascular exam is regular rate and rhythm.  Abdominal exam nontender or distended. No masses palpated. Extremities show no edema. neuro grossly intact    Lab Results: Basic Metabolic Panel:  Recent Labs  07/24/14 0420 07/26/14 0300  NA 142 139  K 4.4 4.0  CL 103 104  CO2 28 24  GLUCOSE 97 93  BUN 11 12  CREATININE 0.98 0.86  CALCIUM 9.0 8.7   CBC:  Recent Labs  07/25/14 0256 07/26/14 0300  WBC 8.5 8.3  HGB 14.5 14.0  HCT 42.7 42.0  MCV 91.0 93.1  PLT 197 198   Cardiac Enzymes:  Recent Labs  07/23/14 2324 07/24/14 0420 07/24/14 1015  TROPONINI 1.37* 1.25* 0.56*     Assessment/Plan:  1 non-ST elevation myocardial infarction-patient remains pain-free. Continue aspirin, statin, beta blocker and heparin. Proceed with cardiac catheterization today. The risks and benefits were discussed and the patient agrees to proceed. 2 tobacco abuse-patient counseled on discontinuing. 3 hypertension-continue present medications but increase metoprolol to 25 mg by mouth twice a day. 4 hyperlipidemia-continue statin.  Kirk Ruths 07/26/2014, 10:39 AM

## 2014-07-27 DIAGNOSIS — E785 Hyperlipidemia, unspecified: Secondary | ICD-10-CM

## 2014-07-27 DIAGNOSIS — Z9861 Coronary angioplasty status: Secondary | ICD-10-CM

## 2014-07-27 LAB — CBC
HEMATOCRIT: 39.6 % (ref 39.0–52.0)
Hemoglobin: 14 g/dL (ref 13.0–17.0)
MCH: 32.6 pg (ref 26.0–34.0)
MCHC: 35.4 g/dL (ref 30.0–36.0)
MCV: 92.1 fL (ref 78.0–100.0)
PLATELETS: 345 10*3/uL (ref 150–400)
RBC: 4.3 MIL/uL (ref 4.22–5.81)
RDW: 13.6 % (ref 11.5–15.5)
WBC: 7 10*3/uL (ref 4.0–10.5)

## 2014-07-27 LAB — BASIC METABOLIC PANEL WITH GFR
Anion gap: 12 (ref 5–15)
BUN: 14 mg/dL (ref 6–23)
CO2: 23 meq/L (ref 19–32)
Calcium: 8.9 mg/dL (ref 8.4–10.5)
Chloride: 107 meq/L (ref 96–112)
Creatinine, Ser: 0.93 mg/dL (ref 0.50–1.35)
GFR calc Af Amer: >90 mL/min (ref >90)
GFR calc non Af Amer: >90 mL/min (ref >90)
Glucose, Bld: 95 mg/dL (ref 70–99)
Potassium: 4.7 meq/L (ref 3.7–5.3)
Sodium: 142 meq/L (ref 137–147)

## 2014-07-27 LAB — PLATELET COUNT: Platelets: 208 K/uL (ref 150–400)

## 2014-07-27 MED ORDER — ATORVASTATIN CALCIUM 80 MG PO TABS: 80.0000 mg | ORAL_TABLET | Freq: Every day | ORAL | Status: DC

## 2014-07-27 MED ORDER — METOPROLOL TARTRATE 25 MG PO TABS: 25.0000 mg | ORAL_TABLET | Freq: Two times a day (BID) | ORAL | Status: DC

## 2014-07-27 MED ORDER — IRBESARTAN 150 MG PO TABS: 150.0000 mg | ORAL_TABLET | Freq: Every day | ORAL | Status: DC

## 2014-07-27 MED ORDER — TICAGRELOR 90 MG PO TABS: 90.0000 mg | ORAL_TABLET | Freq: Two times a day (BID) | ORAL | Status: DC

## 2014-07-27 NOTE — Plan of Care (Signed)
Problem: Consults Goal: Tobacco Cessation referral if indicated Outcome: Completed/Met Date Met:  07/27/14 Smoking cessation discussed in depth.  RN counseled patient on the dangers of tobacco use, advised patient to stop smoking, and reviewed strategies to maximize success.  Tips for success sheet given including Quit hotline phone #. Advised pt to evaluate triggers to smoke and develop plan with new coping strategies.  Informed pt of increased risk for stroke or MI if smoking while using nicotine gum or patch.  Pt voiced understanding and encouraged pt to try to quit upon discharge.  Pt ready to quit, states "done with smoking."

## 2014-07-27 NOTE — Progress Notes (Signed)
Subjective: No complaints  Objective: Vital signs in last 24 hours: Temp:  [97.5 F (36.4 C)-98.1 F (36.7 C)] 97.5 F (36.4 C) (09/29 0625) Pulse Rate:  [57-77] 57 (09/29 0625) Resp:  [10-20] 20 (09/29 0625) BP: (112-146)/(65-89) 146/89 mmHg (09/29 0625) SpO2:  [95 %-99 %] 98 % (09/29 0625) Weight:  [191 lb 9.3 oz (86.9 kg)] 191 lb 9.3 oz (86.9 kg) (09/29 0100) Last BM Date: 07/26/14  Intake/Output from previous day: 09/28 0701 - 09/29 0700 In: 1486.1 [P.O.:670; I.V.:816.1] Out: 1450 [Urine:1450] Intake/Output this shift:    Medications Current Facility-Administered Medications  Medication Dose Route Frequency Provider Last Rate Last Dose  . acetaminophen (TYLENOL) tablet 650 mg  650 mg Oral Q4H PRN Jettie Booze, MD      . aspirin chewable tablet 81 mg  81 mg Oral Daily Jettie Booze, MD      . atorvastatin (LIPITOR) tablet 80 mg  80 mg Oral q1800 Alwyn Pea, MD   80 mg at 07/25/14 1815  . heparin injection 5,000 Units  5,000 Units Subcutaneous 3 times per day Jettie Booze, MD   5,000 Units at 07/27/14 [phone]  . irbesartan (AVAPRO) tablet 150 mg  150 mg Oral Daily Alwyn Pea, MD   150 mg at 07/26/14 1018  . metoprolol tartrate (LOPRESSOR) tablet 25 mg  25 mg Oral BID Lelon Perla, MD   25 mg at 07/26/14 2205  . nitroGLYCERIN (NITROSTAT) SL tablet 0.4 mg  0.4 mg Sublingual Q5 Min x 3 PRN Alwyn Pea, MD      . omega-3 acid ethyl esters (LOVAZA) capsule 1 g  1 g Oral Daily Alwyn Pea, MD   1 g at 07/26/14 1018  . ondansetron (ZOFRAN) injection 4 mg  4 mg Intravenous Q6H PRN Jettie Booze, MD      . ticagrelor [facility]) tablet 90 mg  90 mg Oral BID Jettie Booze, MD   90 mg at 07/27/14 4196    PE: General appearance: alert, cooperative and no distress Lungs: clear to auscultation bilaterally Heart: regular rate and rhythm, S1, S2 normal, no murmur, click, rub or gallop Extremities: No LEE Pulses: 2+ and  symmetric Skin: Warm and dry.  CAth site w/o ecchymosis, nontender Neurologic: Grossly normal  Lab Results:   Recent Labs  07/25/14 0256 07/26/14 0300 07/26/14 2353 07/27/14 0404  WBC 8.5 8.3  --  7.0  HGB 14.5 14.0  --  14.0  HCT 42.7 42.0  --  39.6  PLT 197 198 208 345   BMET  Recent Labs  07/26/14 0300 07/27/14 0404  NA 139 142  K 4.0 4.7  CL 104 107  CO2 24 23  GLUCOSE 93 95  BUN 12 14  CREATININE 0.86 0.93  CALCIUM 8.7 8.9   PT/INR  Recent Labs  07/26/14 0300  LABPROT 13.3  INR 1.01   Lipid Panel     Component Value Date/Time   CHOL 211* 07/24/2014 0420   TRIG 196* 07/24/2014 0420   HDL 32* 07/24/2014 0420   CHOLHDL 6.6 07/24/2014 0420   VLDL 39 07/24/2014 0420   LDLCALC 140* 07/24/2014 0420     Assessment/Plan  Principal Problem:   NSTEMI (non-ST elevated myocardial infarction) Active Problems:   HTN (hypertension), benign   Dyslipidemia   Tobacco abuse  Plan:    SP LHC revealing Normal left main coronary artery.  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  This was successfully treated with aspiration thrombectomy followed by stenting with a 2.75 x 28 Promus drug-eluting stent, postdilated to greater than 3 mm in diameter. Mild scattered plaque in the left circumflex artery and its branches. Mild scattered plaque right coronary artery. Normal left ventricular systolic function. LVEDP 7 mmHg. Ejection fraction 60%.  ASA, Brilinta, lipitor 80, irbesartan 150, lopressor 25 bid, Lovaza 1g   Tobacco cessation discussed.   Ambulation and education with cardiac rehab   LFTs in 6 weeks.  Statin is new.   LOS: 4 days    Jaymee Tilson PA-C 07/27/2014 7:21 AM

## 2014-07-27 NOTE — Progress Notes (Signed)
I personally saw & examined the patient this AM.  All available chart & nursing data was reviewed. -- I agree with Mr. Amador Cunas findings, exam & recommendations.  OK for d/c home. DAPT, BB, ARB, Statin. [place] Consulted - he is interested in Phase II.  Leonie Man, M.D., M.S. Interventional Cardiologist   Pager # [phone]

## 2014-07-27 NOTE — Discharge Summary (Signed)
Physician Discharge Summary    Cardiologist:  [place](new) [name] MRN: [id number] DOB/AGE: Jun 14, 1961 53 y.o.  Admit date: 07/23/2014 Discharge date: 07/27/2014  Admission Diagnoses:  NSTEMI  Discharge Diagnoses:  Principal Problem:   NSTEMI (non-ST elevated myocardial infarction) Active Problems:   HTN (hypertension), benign   Dyslipidemia   Tobacco abuse   Discharged Condition: stable  Hospital Course:   Donald Torres is a 53 y.o. male who presented for evaluation of chest pain. He was working on his car this am changing tires when he developed chest pain. Pain was described as aching/pressure like sensation. He rated pressure 5/10 and rated across chest and to both arms bilaterally. This was around 10am. He took his am meds and an ASA. He called his wife who works in 59 office as pressure did not go away after resting for a few minutes. He has had chest pain before but was concerned since it never last long. He was seen around noon. He had lab work and ekg done. He still felt drained and mild pressure at this time. He was called later on around 5pm with abnormal troponins. He thus presented to the ED for further evaluation. He was given 2 Nitro in the ED and pressure was 1/10. His troponins are 0.58, 1.08.  Previous diagnostic testing for coronary artery disease includes: none.  Previous history of cardiac disease includes None.   Coronary artery disease risk factors include: dyslipidemia, hypertension, male gender and smoking/ tobacco exposure.  Patient denies history of CABG, cardiomyopathy, coronary angioplasty, coronary artery stent, ischemic heart disease, pericarditis, previous M.I. and valvular disease.  He was admitted and started on ASA, Lovenox Q12H, BB, ARB, Statin, NTG prn.  Echo revealed preserved EF, G1DD.  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 This was successfully treated with aspiration thrombectomy followed by stenting with a 2.75 x 28 Promus drug-eluting stent, postdilated to greater than 3 mm in diameter(See full cath report below).  He was started on Brilinta in addition to ASA.  High dose statin.  He will need follow up LFTs in 6 weeks.  The patient was seen by Dr. Ellyn Hack who felt he was stable for DC home.     Consults: None  Significant Diagnostic Studies:  Echocardiogram, Study Conclusions  - Left ventricle: The cavity size was normal. Wall thickness was normal. Systolic function was normal. The estimated ejection fraction was in the range of 55% to 60%. Wall motion was normal; there were no regional wall motion abnormalities. Doppler parameters are consistent with abnormal left ventricular relaxation (grade 1 diastolic dysfunction). - Aortic valve: Poorly visualized. Probably trileaflet. There was no significant regurgitation. - Left atrium: The atrium was mildly dilated. - Right atrium: The atrium was mildly dilated. Central venous pressure (est): 3 mm Hg. - Atrial septum: A patent foramen ovale cannot be excluded. - Tricuspid valve: There was trivial regurgitation. - Pulmonary arteries: Systolic pressure could not be accurately estimated. - Pericardium, extracardiac: There was no pericardial effusion.  Impressions:  - Normal LV wall thickness and chamber size with LVEF 74-12%, grade 1 diastolic dysfunction. Mild left atrial enlargement. Trivial tricuspid regurgitation, unable to assess PASP.    PROCEDURE: Left heart catheterization with selective coronary angiography, left ventriculogra PCI LAD; aspiration thrombectomy of the LAD.  INDICATIONS: NSTEMI  The risks, benefits, and details of the procedure were explained to the patient. The patient  verbalized understanding and wanted to proceed. Informed written consent was obtained.  PROCEDURE TECHNIQUE: After Xylocaine anesthesia a 44F slender  sheath was placed in the right radial artery with a single anterior needle wall stick. IV heparin was given. Right coronary angiography was done using a Judkins R4 guide catheter. Left coronary angiography was done using a Judkins L3.5 guide catheter. Left ventriculography was done using a pigtail catheter. The intervention was performed. Please see below for details. IV heparin was given. IV tirofiban was given. ACT was used to check that the anticoagulation was therapeutic. A TR band was used for hemostasis.  CONTRAST: Total of 170 cc.  COMPLICATIONS: None.  HEMODYNAMICS: Aortic pressure was 99/59; LV pressure was 101/1; LVEDP 6. There was no gradient between the left ventricle and aorta.  ANGIOGRAPHIC DATA: The left main coronary artery is widely patent.  The left anterior descending artery is a large vessel which wraps around the apex. In the mid vessel, there is visible, mobile thrombus, up to 90% obstructive. After the thrombotic area, there is a medium-sized diagonal which is patent. The mid to distal LAD is widely patent.  The left circumflex artery is a large vessel which is widely patent. The first obtuse marginal is large and widely patent. There is mild atherosclerosis in the mid circumflex. There is a large ramus vessel which is widely patent. There is no significant obstructive disease.  The right coronary artery is a large dominant vessel with mild atherosclerosis .  LEFT VENTRICULOGRAM: Left ventricular angiogram was done in the 30 RAO projection and revealed normal left ventricular wall motion and systolic function with an estimated ejection fraction of 60 %. LVEDP was 7 mmHg.  PCI NARRATIVE: A CLS 3.0 guiding catheters using his left main. A pro-water wire was placed across the area of thrombus and disease in the mid LAD. A priority 1 aspiration catheter was advanced for several passes. There was removal of thrombus. Nitroglycerin was given intracoronary. A 2.75 x 28 Promus drug-eluting  stent was deployed at 14 atmospheres. The stent was post dilated with a 3.0 x 20 noncompliant balloon to greater than 3 mm in diameter. TIMI-3 flow was maintained throughout. There was an excellent angiographic result.  IMPRESSIONS:  1. Normal left main coronary artery. 2. 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 This was successfully treated with aspiration thrombectomy followed by stenting with a 2.75 x 28 Promus drug-eluting stent, postdilated to greater than 3 mm in diameter. 3. Mild scattered plaque in the left circumflex artery and its branches. 4. Mild scattered plaque right coronary artery. 5. Normal left ventricular systolic function. LVEDP 7 mmHg. Ejection fraction 60%. RECOMMENDATION: Continue dual antiplatelet therapy for at least a year. We spoke extensively about smoking cessation. He'll need aggressive secondary prevention as well. Anticipate discharge tomorrow.   Treatments: See above  Discharge Exam: Blood pressure 146/89, pulse 57, temperature 97.5 F (36.4 C), temperature source Oral, resp. rate 20, height 6' (1.829 m), weight 191 lb 9.3 oz (86.9 kg), SpO2 98.00%.   Disposition: 01-Home or Self Care      Discharge Instructions   Diet - low sodium heart healthy    Complete by:  As directed      Discharge instructions    Complete by:  As directed   No lifting with right arm for three days.     Increase activity slowly    Complete by:  As directed  Medication List    STOP taking these medications       amLODipine 2.5 MG tablet  Commonly known as:  NORVASC     TRIBENZOR 40-5-12.5 MG Tabs  Generic drug:  Olmesartan-Amlodipine-HCTZ      TAKE these medications       ALPRAZolam 0.5 MG tablet  Commonly known as:  XANAX  Take 1 tablet by mouth daily as needed. For anxiety     aspirin EC 81 MG tablet  Take 81 mg by mouth daily.     atorvastatin 80 MG tablet  Commonly known as:   LIPITOR  Take 1 tablet (80 mg total) by mouth daily at 6 PM.     FISH OIL PO  Take 1 capsule by mouth daily.     irbesartan 150 MG tablet  Commonly known as:  AVAPRO  Take 1 tablet (150 mg total) by mouth daily.     metoprolol tartrate 25 MG tablet  Commonly known as:  LOPRESSOR  Take 1 tablet (25 mg total) by mouth 2 (two) times daily.     NITROSTAT 0.4 MG SL tablet  Generic drug:  nitroGLYCERIN  Place 1 tablet under the tongue every 5 (five) minutes x 3 doses as needed. For chest pain     ticagrelor 90 MG Tabs tablet  Commonly known as:  BRILINTA  Take 1 tablet (90 mg total) by mouth 2 (two) times daily.        Greater than 30 minutes was spent completing the patient's discharge.    SignedTarri Fuller, [place] 07/27/2014, 7:58 AM

## 2014-07-27 NOTE — Discharge Summary (Signed)
I saw the patient on the AM of discharge.  Was doing well s/p PCI.  Wrist stable.  OK for d/c.  I agree with the summary.  Leonie Man, M.D., M.S. Interventional Cardiologist   Pager # [phone]

## 2014-07-27 NOTE — Plan of Care (Signed)
Problem: Consults Goal: MI Patient Education (See Patient Education module for education specifics.)  Outcome: Progressing Discussed MI education with pt and family.  Pt states already watched MI video and has "Bouncing back from heart attack" book.  New meds discussed, RN stressed importance of medication compliance.  Pt voiced understanding.

## 2014-07-27 NOTE — Care Management Note (Signed)
    Page 1 of 1   07/27/2014     11:13:24 AM CARE MANAGEMENT NOTE 07/27/2014  Patient:  Donald Torres, Donald Torres   Account Number:  [id number]  Date Initiated:  07/27/2014  Documentation initiated by:  [facility]  Subjective/Objective Assessment:   53 y.o. male who presents for evaluation of chest pain.//Home with spouse.     Action/Plan:   Procedure:  LEFT HEART CATHETERIZATION WITH CORONARY ANGIOGRAM as a surgical intervention//Benefits check for Brilinta.   Anticipated DC Date:  07/27/2014   Anticipated DC Plan:  [place]  CM consult  Medication Assistance      Choice offered to / List presented to:             Status of service:  Completed, signed off Medicare Important Message given?  NO (If response is "NO", the following Medicare IM given date fields will be blank) Date Medicare IM given:   Medicare IM given by:   Date Additional Medicare IM given:   Additional Medicare IM given by:    Discharge Disposition:    Per UR Regulation:  Reviewed for med. necessity/level of care/duration of stay  If discussed at [place] of Stay Meetings, dates discussed:    Comments:  07/27/14 0945 Donald Torres J. Donald Laming, RN, BSN, [employer] [phone] Spoke with pt at bedside regarding benefits check for Brilinta.  Pt has brochure with 30 day free card and refill assistance card intact.  Pt utilizes [facility] at [facility] for prescription needs.  NCM called pharmacy to confirm availability of medication. Information relayed to pt.  Pt verbalizes importance of filling medication upon discharge.

## 2014-07-27 NOTE — Progress Notes (Signed)
CARDIAC REHAB PHASE I     Walking in halls independently with wife     MODE:  Ambulation: 500 ft   POST:  Rate/Rhythm: 90 SR  BP:  Supine: 157/79  Sitting:   Standing:    SaO2:  8412-8208 Pt walked independently in hall with steady gait. No CP. MI education completed with pt and wife who voiced understanding. Gave smoking cessation handouts and encouraged pt to call 1800quitnow as needed. Pt is planning on quitting cold [place]. Has brililnta packet. Discussed importance of taking brilinta twice daily. Reviewed NTG use. Notified  pt that HGA1C 6.3 and he needed to watch carbs, exercise and make sure PCP monitoring this. Discussed CRP 2 but pt's work schedule is not conducive to be able to attend. Gave brochure in case his work will adjust his hours. Encouraged walking as ex.  Graylon Good, RN BSN  07/27/2014 8:29 AM

## 2014-07-27 NOTE — Progress Notes (Signed)
TR BAND REMOVAL  LOCATION:    right radial  DEFLATED PER PROTOCOL:    Yes.    TIME BAND OFF / DRESSING APPLIED:    2345    SITE UPON ARRIVAL:    Level 0  SITE AFTER BAND REMOVAL:    Level 0  REVERSE ALLEN'S TEST:     positive  CIRCULATION SENSATION AND MOVEMENT:    Within Normal Limits   Yes.    COMMENTS:   See freq vs.   Post radial cath care sheet given and reviewed w/ pt and wife, both voice understanding.

## 2014-08-02 ENCOUNTER — Encounter: Payer: Self-pay | Admitting: Physician Assistant

## 2014-08-04 ENCOUNTER — Encounter: Payer: Self-pay | Admitting: *Deleted

## 2014-08-05 ENCOUNTER — Ambulatory Visit: Payer: BC Managed Care – PPO | Admitting: Cardiology

## 2014-08-11 ENCOUNTER — Ambulatory Visit (INDEPENDENT_AMBULATORY_CARE_PROVIDER_SITE_OTHER): Payer: BC Managed Care – PPO | Admitting: Physician Assistant

## 2014-08-11 ENCOUNTER — Encounter: Payer: Self-pay | Admitting: Physician Assistant

## 2014-08-11 VITALS — BP 190/80 | HR 60 | Ht 72.0 in | Wt 195.0 lb

## 2014-08-11 DIAGNOSIS — I10 Essential (primary) hypertension: Secondary | ICD-10-CM

## 2014-08-11 DIAGNOSIS — Z23 Encounter for immunization: Secondary | ICD-10-CM

## 2014-08-11 DIAGNOSIS — I2510 Atherosclerotic heart disease of native coronary artery without angina pectoris: Secondary | ICD-10-CM

## 2014-08-11 DIAGNOSIS — E785 Hyperlipidemia, unspecified: Secondary | ICD-10-CM

## 2014-08-11 MED ORDER — AMLODIPINE BESYLATE 5 MG PO TABS: 5.0000 mg | ORAL_TABLET | Freq: Every day | ORAL | Status: AC

## 2014-08-11 NOTE — Progress Notes (Signed)
Cardiology Office Note    Date:  08/11/2014   ID:  Donald Torres, DOB 01/18/1961, MRN [id number]  PCP:  Mathews Argyle, MD  Cardiologist:  Dr. Casandra Doffing     History of Present Illness: Donald Torres is a 53 y.o. male with a history of hypertension and tobacco abuse. He was admitted 9/25-9/29 with a non-STEMI. LHC demonstrated a 90% lesion in the LAD with mobile thrombus. This was treated with aspiration thrombectomy and a DES.  Ejection fraction was normal. He returns for followup.  He has felt well since discharge.  The patient denies chest pain, shortness of breath, syncope, orthopnea, PND or significant pedal edema.    Studies:  - LHC (9/15):  Mid LAD 90% with mobile thrombus, EF 60% >> PCI:  Aspiration thrombectomy + 2.75 x 28 Promus DES to mid LAD  - Echo (9/15):  EF 55-60%, no RWMA, Gr 1 DD, mild BAE    Recent Labs/Images:  Recent Labs  07/24/14 0420  07/27/14 0404  NA 142  < > 142  K 4.4  < > 4.7  BUN 11  < > 14  CREATININE 0.98  < > 0.93  HGB 14.3  < > 14.0  LDLCALC 140*  --   --   HDL 32*  --   --   < > = values in this interval not displayed.   Dg Chest 2 View  07/23/2014   IMPRESSION: No active cardiopulmonary disease.   Electronically Signed   By: Markus Daft M.D.   On: 07/23/2014 19:23     Wt Readings from Last 3 Encounters:  08/11/14 195 lb (88.451 kg)  07/27/14 191 lb 9.3 oz (86.9 kg)  07/27/14 191 lb 9.3 oz (86.9 kg)     Past Medical History  Diagnosis Date  . Hypertension   . High cholesterol     Current Outpatient Prescriptions  Medication Sig Dispense Refill  . ALPRAZolam (XANAX) 0.5 MG tablet Take 1 tablet by mouth daily as needed. For anxiety      . aspirin EC 81 MG tablet Take 81 mg by mouth daily.      Marland Kitchen atorvastatin (LIPITOR) 80 MG tablet Take 1 tablet (80 mg total) by mouth daily at 6 PM.  30 tablet  5  . irbesartan (AVAPRO) 150 MG tablet Take 1 tablet (150 mg total) by mouth daily.  30 tablet  5  . metoprolol tartrate  (LOPRESSOR) 25 MG tablet Take 1 tablet (25 mg total) by mouth 2 (two) times daily.  60 tablet  5  . NITROSTAT 0.4 MG SL tablet Place 1 tablet under the tongue every 5 (five) minutes x 3 doses as needed. For chest pain      . Omega-3 Fatty Acids (FISH OIL PO) Take 1 capsule by mouth daily.      . ticagrelor (BRILINTA) 90 MG TABS tablet Take 1 tablet (90 mg total) by mouth 2 (two) times daily.  60 tablet  10   No current facility-administered medications for this visit.     Allergies:   Review of patient's allergies indicates no known allergies.   Social History:  The patient  reports that he quit smoking about 2 weeks ago. He does not have any smokeless tobacco history on file. He reports that he drinks alcohol. He reports that he does not use illicit drugs.   Family History:  The patient's family history includes Cancer in his mother; Diabetes in his brother; Heart attack in his  brother, father, and another family member; Hypertension in his father and mother.   ROS:  Please see the history of present illness.      All other systems reviewed and negative.    PHYSICAL EXAM: VS:  BP 190/80  Pulse 60  Ht 6' (1.829 m)  Wt 195 lb (88.451 kg)  BMI 26.44 kg/m2 Well nourished, well developed, in no acute distress HEENT: normal Neck: no JVD Cardiac:  normal S1, S2; RRR; no murmur Lungs:  clear to auscultation bilaterally, no wheezing, rhonchi or rales Abd: soft, nontender, no hepatomegaly Ext: right wrist without hematoma or mass ; no edema Skin: warm and dry Neuro:  CNs 2-12 intact, no focal abnormalities noted  EKG:  NSR, HR 60, normal axis, NSSTTW changes      ASSESSMENT AND PLAN:  1. Coronary artery disease:  He is doing well after recent admission for a non-STEMI treated with PCI and DES to the LAD.  EF is preserved.  We discussed the importance of dual antiplatelet therapy. Continue aspirin, Brilinta, statin, beta blocker. He is not interested in formal cardiac rehabilitation. His  work schedule will not allow for this.  I have cleared him to return to work. He does not have a job that involves extreme lifting.  2. Essential hypertension:  Blood pressure is uncontrolled. He was previously on amlodipine. I will place him back on amlodipine 5 mg daily. He will have a CMET checked with his follow up lipids in 4 weeks.    3. HLD (hyperlipidemia):  Continue high dose statin. Obtain CMET, Lipids 4 weeks  4. Encounter for immunization:  He will have his flu shot today.   Disposition:   FU with me in 4 weeks and Dr. Irish Lack in 3 months.   Signed, Versie Starks, MHS 08/11/2014 12:45 PM    [facility] [place], [place], [place]  [address] Phone: [phone]; Fax: [phone]

## 2014-08-11 NOTE — Patient Instructions (Addendum)
Start taking Amlodipine 5 mg daily in addition to your other medications.  This is for blood pressure.  It has been sent to your pharmacy.  You may get your Flu shot today.  Do not stop taking Aspirin or Brilinta.  You must ask your cardiologist prior to stopping this.  You can return to work now.  Return for lab work in about 4 weeks (CMET, Lipids.  This must be done fasting.  Schedule follow up with Richardson Dopp, PA-C in 4 weeks.  Schedule follow up with Dr. Casandra Doffing in 3 mos.

## 2014-09-10 ENCOUNTER — Ambulatory Visit (INDEPENDENT_AMBULATORY_CARE_PROVIDER_SITE_OTHER): Payer: BC Managed Care – PPO | Admitting: Physician Assistant

## 2014-09-10 ENCOUNTER — Other Ambulatory Visit (INDEPENDENT_AMBULATORY_CARE_PROVIDER_SITE_OTHER): Payer: BC Managed Care – PPO | Admitting: *Deleted

## 2014-09-10 ENCOUNTER — Encounter: Payer: Self-pay | Admitting: Physician Assistant

## 2014-09-10 VITALS — BP 150/90 | HR 58 | Ht 73.0 in | Wt 198.8 lb

## 2014-09-10 DIAGNOSIS — I2510 Atherosclerotic heart disease of native coronary artery without angina pectoris: Secondary | ICD-10-CM

## 2014-09-10 DIAGNOSIS — E785 Hyperlipidemia, unspecified: Secondary | ICD-10-CM

## 2014-09-10 DIAGNOSIS — I10 Essential (primary) hypertension: Secondary | ICD-10-CM

## 2014-09-10 MED ORDER — CARVEDILOL 6.25 MG PO TABS: 6.2500 mg | ORAL_TABLET | Freq: Two times a day (BID) | ORAL | Status: DC

## 2014-09-10 NOTE — Progress Notes (Signed)
Cardiology Office Note   Date:  09/10/2014   ID:  Donald Torres, DOB 1961/09/21, MRN [id number]  PCP:  Mathews Argyle, MD  Cardiologist:  Dr. Casandra Doffing     History of Present Illness: Donald Torres is a 53 y.o. male with a history of CAD, HTN and tobacco abuse. He was admitted 06/2014 with a non-STEMI. LHC demonstrated a 90% lesion in the LAD with mobile thrombus that was treated with aspiration thrombectomy and a DES.  EF was normal. Last seen by me 08/11/14.  He returns for further management of his BP.  He is overall doing well.  BPs at home avg 150/80s.  The patient denies chest pain, shortness of breath, syncope, orthopnea, PND or significant pedal edema.  He walks 1-2 miles most days of the week.  He is watching his salt.  He is not smoking cigarettes.     Recent Labs/Images:  Recent Labs  07/24/14 0420  07/27/14 0404  NA 142  < > 142  K 4.4  < > 4.7  BUN 11  < > 14  CREATININE 0.98  < > 0.93  HGB 14.3  < > 14.0  LDLCALC 140*  --   --   HDL 32*  --   --   < > = values in this interval not displayed.     Wt Readings from Last 3 Encounters:  09/10/14 198 lb 12.8 oz (90.175 kg)  08/11/14 195 lb (88.451 kg)  07/27/14 191 lb 9.3 oz (86.9 kg)     Past Medical History  Diagnosis Date  . Hypertension   . High cholesterol   . CAD (coronary artery disease)     NSTEMI 06/2014 >>> LHC (9/15):  Mid LAD 90% with mobile thrombus, EF 60% >> PCI:  Aspiration thrombectomy + 2.75 x 28 Promus DES to mid LAD  . Hx of echocardiogram     Echo (9/15):  EF 55-60%, no RWMA, Gr 1 DD, mild BAE    Current Outpatient Prescriptions  Medication Sig Dispense Refill  . ALPRAZolam (XANAX) 0.5 MG tablet Take 1 tablet by mouth daily as needed. For anxiety    . amLODipine (NORVASC) 5 MG tablet Take 1 tablet (5 mg total) by mouth daily. 30 tablet 11  . aspirin EC 81 MG tablet Take 81 mg by mouth daily.    Marland Kitchen atorvastatin (LIPITOR) 80 MG tablet Take 1 tablet (80 mg total) by mouth  daily at 6 PM. 30 tablet 5  . irbesartan (AVAPRO) 150 MG tablet Take 1 tablet (150 mg total) by mouth daily. 30 tablet 5  . metoprolol tartrate (LOPRESSOR) 25 MG tablet Take 1 tablet (25 mg total) by mouth 2 (two) times daily. 60 tablet 5  . NITROSTAT 0.4 MG SL tablet Place 1 tablet under the tongue every 5 (five) minutes x 3 doses as needed. For chest pain    . Omega-3 Fatty Acids (FISH OIL PO) Take 1 capsule by mouth daily.    . ticagrelor (BRILINTA) 90 MG TABS tablet Take 1 tablet (90 mg total) by mouth 2 (two) times daily. 60 tablet 10   No current facility-administered medications for this visit.     Allergies:   Review of patient's allergies indicates no known allergies.   Social History:  The patient  reports that he quit smoking about 7 weeks ago. He does not have any smokeless tobacco history on file. He reports that he drinks alcohol. He reports that he does not use illicit  drugs.   Family History:  The patient's family history includes Cancer in his mother; Diabetes in his brother; Heart attack in his brother, father, and another family member; Hypertension in his father and mother.   ROS:  Please see the history of present illness.   No HAs.   All other systems reviewed and negative.    PHYSICAL EXAM: VS:  BP 150/90 mmHg  Pulse 58  Ht 6\' 1"  (1.854 m)  Wt 198 lb 12.8 oz (90.175 kg)  BMI 26.23 kg/m2 Well nourished, well developed, in no acute distress HEENT: normal Neck: no JVD Cardiac:  normal S1, S2;  RRR; no murmur   Lungs:   clear to auscultation bilaterally, no wheezing, rhonchi or rales Abd:  soft, nontender, no hepatomegaly Ext:  no edema Skin: warm and dry Neuro:  CNs 2-12 intact, no focal abnormalities noted  EKG:  Sinus brady HR 58 no change from prior tracing      ASSESSMENT AND PLAN:  1.  Coronary artery disease:  No angina.  Continue ASA, Brilinta, statin, beta blocker.  2.  Essential hypertension:  BP still not controlled.     -  DC Metoprolol.    -   Start Carvedilol 6.25 mg bid.    -  Continue Avapro, Amlodipine.   3.  HLD (hyperlipidemia):  Continue statin.  Arrange FU CMET, Lipids.   Disposition:   FU with Dr. Irish Lack 2 mos   Signed, Versie Starks, MHS 09/10/2014 8:46 AM    [facility] [place], [place], [place]  [address] Phone: [phone]; Fax: [phone]

## 2014-09-10 NOTE — Patient Instructions (Signed)
FOLLOW UP WITH DR. VARANASI IN 2 MONTHS  RESCHEDULE YOUR FASTING LAB WORK   FINISH YOUR BOTTLE OF METOPROLOL  ONCE DONE WITH METOPROLOL YOU WILL THEN START ON COREG 6.25 MG 1 TABLET TWICE DAILY; MAKE SURE TO SPACE OUT 12 HOURS APART

## 2014-09-16 NOTE — Progress Notes (Signed)
Pt coming in 11/20 for lab

## 2014-09-17 ENCOUNTER — Other Ambulatory Visit (INDEPENDENT_AMBULATORY_CARE_PROVIDER_SITE_OTHER): Payer: BC Managed Care – PPO | Admitting: *Deleted

## 2014-09-17 DIAGNOSIS — E785 Hyperlipidemia, unspecified: Secondary | ICD-10-CM

## 2014-09-17 DIAGNOSIS — I10 Essential (primary) hypertension: Secondary | ICD-10-CM

## 2014-09-17 LAB — LIPID PANEL
CHOL/HDL RATIO: 4
Cholesterol: 138 mg/dL (ref 0–200)
HDL: 35.5 mg/dL — ABNORMAL LOW (ref >39.00)
LDL Cholesterol: 78 mg/dL (ref 0–99)
NONHDL: 102.5
TRIGLYCERIDES: 121 mg/dL (ref 0.0–149.0)
VLDL: 24.2 mg/dL (ref 0.0–40.0)

## 2014-09-17 LAB — COMPREHENSIVE METABOLIC PANEL
ALBUMIN: 4.1 g/dL (ref 3.5–5.2)
ALK PHOS: 98 U/L (ref 39–117)
ALT: 30 U/L (ref 0–53)
AST: 20 U/L (ref 0–37)
BUN: 13 mg/dL (ref 6–23)
CALCIUM: 9.1 mg/dL (ref 8.4–10.5)
CHLORIDE: 108 meq/L (ref 96–112)
CO2: 25 mEq/L (ref 19–32)
Creatinine, Ser: 0.8 mg/dL (ref 0.4–1.5)
GFR: 103.47 mL/min (ref >60.00)
Glucose, Bld: 84 mg/dL (ref 70–99)
Potassium: 4 mEq/L (ref 3.5–5.1)
SODIUM: 140 meq/L (ref 135–145)
TOTAL PROTEIN: 7 g/dL (ref 6.0–8.3)
Total Bilirubin: 0.6 mg/dL (ref 0.2–1.2)

## 2014-09-21 ENCOUNTER — Telehealth: Payer: Self-pay | Admitting: *Deleted

## 2014-09-21 NOTE — Telephone Encounter (Signed)
pt notified about lab results with verbal understanding  

## 2014-10-07 ENCOUNTER — Encounter (HOSPITAL_COMMUNITY): Payer: Self-pay | Admitting: Interventional Cardiology

## 2014-11-19 ENCOUNTER — Ambulatory Visit: Payer: BC Managed Care – PPO | Admitting: Interventional Cardiology

## 2014-12-20 ENCOUNTER — Ambulatory Visit: Payer: Self-pay | Admitting: Interventional Cardiology

## 2014-12-24 ENCOUNTER — Other Ambulatory Visit: Payer: Self-pay | Admitting: Physician Assistant

## 2015-01-09 ENCOUNTER — Other Ambulatory Visit: Payer: Self-pay | Admitting: Physician Assistant

## 2015-02-02 ENCOUNTER — Encounter: Payer: Self-pay | Admitting: Interventional Cardiology

## 2015-02-02 ENCOUNTER — Ambulatory Visit (INDEPENDENT_AMBULATORY_CARE_PROVIDER_SITE_OTHER): Payer: BLUE CROSS/BLUE SHIELD | Admitting: Interventional Cardiology

## 2015-02-02 VITALS — BP 132/88 | HR 84 | Ht 73.0 in | Wt 200.0 lb

## 2015-02-02 DIAGNOSIS — I2510 Atherosclerotic heart disease of native coronary artery without angina pectoris: Secondary | ICD-10-CM | POA: Diagnosis not present

## 2015-02-02 DIAGNOSIS — E785 Hyperlipidemia, unspecified: Secondary | ICD-10-CM | POA: Diagnosis not present

## 2015-02-02 DIAGNOSIS — I10 Essential (primary) hypertension: Secondary | ICD-10-CM | POA: Diagnosis not present

## 2015-02-02 DIAGNOSIS — I214 Non-ST elevation (NSTEMI) myocardial infarction: Secondary | ICD-10-CM

## 2015-02-02 NOTE — Progress Notes (Signed)
[name], male   DOB: 15-Aug-1961, 54 y.o.   MRN: [id number]     Cardiology Office Note   Date:  02/02/2015   ID:  Donald Torres, DOB April 03, 1961, MRN [id number]  PCP:  Mathews Argyle, MD    Chief Complaint  Patient presents with  . post-op     Wt Readings from Last 3 Encounters:  02/02/15 200 lb (90.719 kg)  09/10/14 198 lb 12.8 oz (90.175 kg)  08/11/14 195 lb (88.451 kg)       History of Present Illness: Donald Torres is a 54 y.o. male  with a history of CAD, HTN and tobacco abuse. He was admitted 06/2014 with a non-STEMI. LHC demonstrated a 90% lesion in the LAD with mobile thrombus that was treated with aspiration thrombectomy and a DES. EF was normal.  He is overall doing well. BPs at home avg 130s/80s.Better in the evening usually.  Higher readings are more typically in the morning. The patient denies chest pain, shortness of breath, syncope, orthopnea, PND or significant pedal edema. He walks 1-2 miles most days of the week. He is watching his salt. He is not smoking cigarettes.  He smoked twice since the last visit under high stress, most recently 2 weeks ago.   He is careful to avoid cutting himself.  No bleeding problems.     Past Medical History  Diagnosis Date  . Hypertension   . High cholesterol   . CAD (coronary artery disease)     NSTEMI 06/2014 >>> LHC (9/15):  Mid LAD 90% with mobile thrombus, EF 60% >> PCI:  Aspiration thrombectomy + 2.75 x 28 Promus DES to mid LAD  . Hx of echocardiogram     Echo (9/15):  EF 55-60%, no RWMA, Gr 1 DD, mild BAE    Past Surgical History  Procedure Laterality Date  . Hernia repair    . Left heart catheterization with coronary angiogram N/A 07/26/2014    Procedure: LEFT HEART CATHETERIZATION WITH CORONARY ANGIOGRAM;  Surgeon: Jettie Booze, MD;  Location: [facility] CATH LAB;  Service: Cardiovascular;  Laterality: N/A;     Current Outpatient Prescriptions  Medication Sig Dispense Refill  .  amLODipine (NORVASC) 5 MG tablet Take 1 tablet (5 mg total) by mouth daily. 30 tablet 11  . aspirin EC 81 MG tablet Take 81 mg by mouth daily.    Marland Kitchen atorvastatin (LIPITOR) 80 MG tablet TAKE 1 TABLET (80 MG TOTAL) BY MOUTH DAILY AT 6 PM. 30 tablet 4  . carvedilol (COREG) 6.25 MG tablet Take 1 tablet (6.25 mg total) by mouth 2 (two) times daily. 60 tablet 11  . irbesartan (AVAPRO) 150 MG tablet TAKE 1 TABLET (150 MG TOTAL) BY MOUTH DAILY. 30 tablet 1  . NITROSTAT 0.4 MG SL tablet Place 1 tablet under the tongue every 5 (five) minutes x 3 doses as needed. For chest pain    . Omega-3 Fatty Acids (FISH OIL PO) Take 1 capsule by mouth daily.    Marland Kitchen PARoxetine (PAXIL-CR) 12.5 MG 24 hr tablet Take 12.5 mg by mouth daily.    . ticagrelor (BRILINTA) 90 MG TABS tablet Take 1 tablet (90 mg total) by mouth 2 (two) times daily. 60 tablet 10   No current facility-administered medications for this visit.    Allergies:   Review of patient's allergies indicates no known allergies.    Social History:  The patient  reports that he quit smoking about 6 months ago. He does not  have any smokeless tobacco history on file. He reports that he drinks alcohol. He reports that he does not use illicit drugs.   Family History:  The patient's family history includes Cancer in his mother; Diabetes in his brother; Heart attack in his brother, father, and another family member; Hypertension in his father and mother.    ROS:  Please see the history of present illness.   Otherwise, review of systems are positive for easy nuisance bleeding.   All other systems are reviewed and negative.    PHYSICAL EXAM: VS:  BP 132/88 mmHg  Pulse 84  Ht 6\' 1"  (1.854 m)  Wt 200 lb (90.719 kg)  BMI 26.39 kg/m2  SpO2 94% , BMI Body mass index is 26.39 kg/(m^2). GEN: Well nourished, well developed, in no acute distress HEENT: normal Neck: no JVD, carotid bruits, or masses Cardiac: RRR; no murmurs, rubs, or gallops,no edema  Respiratory:   clear to auscultation bilaterally, normal work of breathing GI: soft, nontender, nondistended, + BS MS: no deformity or atrophy Skin: warm and dry, no rash; 2+ right radial pulse Neuro:  Strength and sensation are intact Psych: euthymic mood, full affect   EKG:      Recent Labs: 07/27/2014: Hemoglobin 14.0; Platelets 345 09/17/2014: ALT 30; BUN 13; Creatinine 0.8; Potassium 4.0; Sodium 140   Lipid Panel    Component Value Date/Time   CHOL 138 09/17/2014 0737   TRIG 121.0 09/17/2014 0737   HDL 35.50* 09/17/2014 0737   CHOLHDL 4 09/17/2014 0737   VLDL 24.2 09/17/2014 0737   LDLCALC 78 09/17/2014 0737     Other studies Reviewed: Additional studies/ records that were reviewed today with results demonstrating: LAD stent in 9/15; Promus DES 28 mm. .   ASSESSMENT AND PLAN:  1. CAD/Old MI: Continue DAPT.  Continue Brilinta for another 6 months.  Then switch Brilinta to Plavix 75 mg daily. No angina.  No bleeding problems. 2. Hyperlipidemia: Continue fish oil and atorvastatin.  LDL 78 in 11/15.  Check again in 9/16.  3. HTN: Controlled now.  Continue current meds.  Readings are much better. COntinue current meds.     Current medicines are reviewed at length with the patient today.  The patient concerns regarding his medicines were addressed.  The following changes have been made:  Will change Brilinta to Plavix in 6 months  Labs/ tests ordered today include: statin panel in 9/16 No orders of the defined types were placed in this encounter.    Recommend 150 minutes/week of aerobic exercise Low fat, low carb, high fiber diet recommended  Disposition:   FU in 1 year   Teresita Madura., MD  02/02/2015 4:10 PM    [facility] [address], [place], [place]  [address] Phone: [phone]; Fax: [phone]    was

## 2015-02-02 NOTE — Patient Instructions (Addendum)
Your physician recommends that you continue on your current medications as directed. Please refer to the Current Medication list given to you today.  Your physician recommends that you return for lab work in: September 2016 (CMET/Fasting Lipids)  Please call out office in September to discuss stopping Brilinta and starting Plavix.  Your physician wants you to follow-up in: 1 year with Dr. Irish Lack. You will receive a reminder letter in the mail two months in advance. If you don't receive a letter, please call our office to schedule the follow-up appointment.

## 2015-02-14 ENCOUNTER — Other Ambulatory Visit: Payer: Self-pay | Admitting: Physician Assistant

## 2015-03-24 ENCOUNTER — Other Ambulatory Visit: Payer: Self-pay | Admitting: Interventional Cardiology

## 2015-07-18 ENCOUNTER — Other Ambulatory Visit (INDEPENDENT_AMBULATORY_CARE_PROVIDER_SITE_OTHER): Payer: BLUE CROSS/BLUE SHIELD | Admitting: *Deleted

## 2015-07-18 DIAGNOSIS — E785 Hyperlipidemia, unspecified: Secondary | ICD-10-CM

## 2015-07-18 LAB — COMPREHENSIVE METABOLIC PANEL
ALT: 24 U/L (ref 0–53)
AST: 17 U/L (ref 0–37)
Albumin: 4.3 g/dL (ref 3.5–5.2)
Alkaline Phosphatase: 110 U/L (ref 39–117)
BUN: 9 mg/dL (ref 6–23)
CHLORIDE: 106 meq/L (ref 96–112)
CO2: 25 mEq/L (ref 19–32)
CREATININE: 0.8 mg/dL (ref 0.40–1.50)
Calcium: 9 mg/dL (ref 8.4–10.5)
GFR: 107.61 mL/min (ref >60.00)
GLUCOSE: 109 mg/dL — AB (ref 70–99)
Potassium: 3.8 mEq/L (ref 3.5–5.1)
SODIUM: 139 meq/L (ref 135–145)
Total Bilirubin: 0.5 mg/dL (ref 0.2–1.2)
Total Protein: 7.3 g/dL (ref 6.0–8.3)

## 2015-07-18 LAB — LIPID PANEL
Cholesterol: 165 mg/dL (ref 0–200)
HDL: 42 mg/dL (ref >39.00)
LDL Cholesterol: 96 mg/dL (ref 0–99)
NONHDL: 123.06
Total CHOL/HDL Ratio: 4
Triglycerides: 137 mg/dL (ref 0.0–149.0)
VLDL: 27.4 mg/dL (ref 0.0–40.0)

## 2015-07-19 ENCOUNTER — Telehealth: Payer: Self-pay | Admitting: *Deleted

## 2015-07-19 MED ORDER — CLOPIDOGREL BISULFATE 75 MG PO TABS: 75.0000 mg | ORAL_TABLET | Freq: Every day | ORAL | Status: DC

## 2015-07-19 NOTE — Telephone Encounter (Signed)
OK to finish out supply of Brilinta and start clopidogrel, 300 mg x 1 and then 75 mg daily after that.

## 2015-07-19 NOTE — Telephone Encounter (Signed)
Spoke with pt and informed him of lab results. Pt states that Dr. Irish Lack had mentioned pt coming off of Brilinta and starting Plavix in August or September. Will forward to Dr. Irish Lack for review and advisement.

## 2015-07-19 NOTE — Telephone Encounter (Signed)
Spoke with pt and informed him of new orders. Verified pharmacy and sent prescription in. Pt verbalized understanding and was in agreement with this plan.

## 2015-07-19 NOTE — Telephone Encounter (Signed)
-----   Message from Jettie Booze, MD sent at 07/18/2015  2:26 PM EDT ----- Lipids, liver, kidney well controlled

## 2015-08-12 IMAGING — CR DG CHEST 2V
2 series · 2 of 2 positions shown · non-contrast
Comparison: 11/21/2010

CLINICAL DATA: Shortness of breath and chest pain.

EXAM:
CHEST  2 VIEW

[w chest pa]
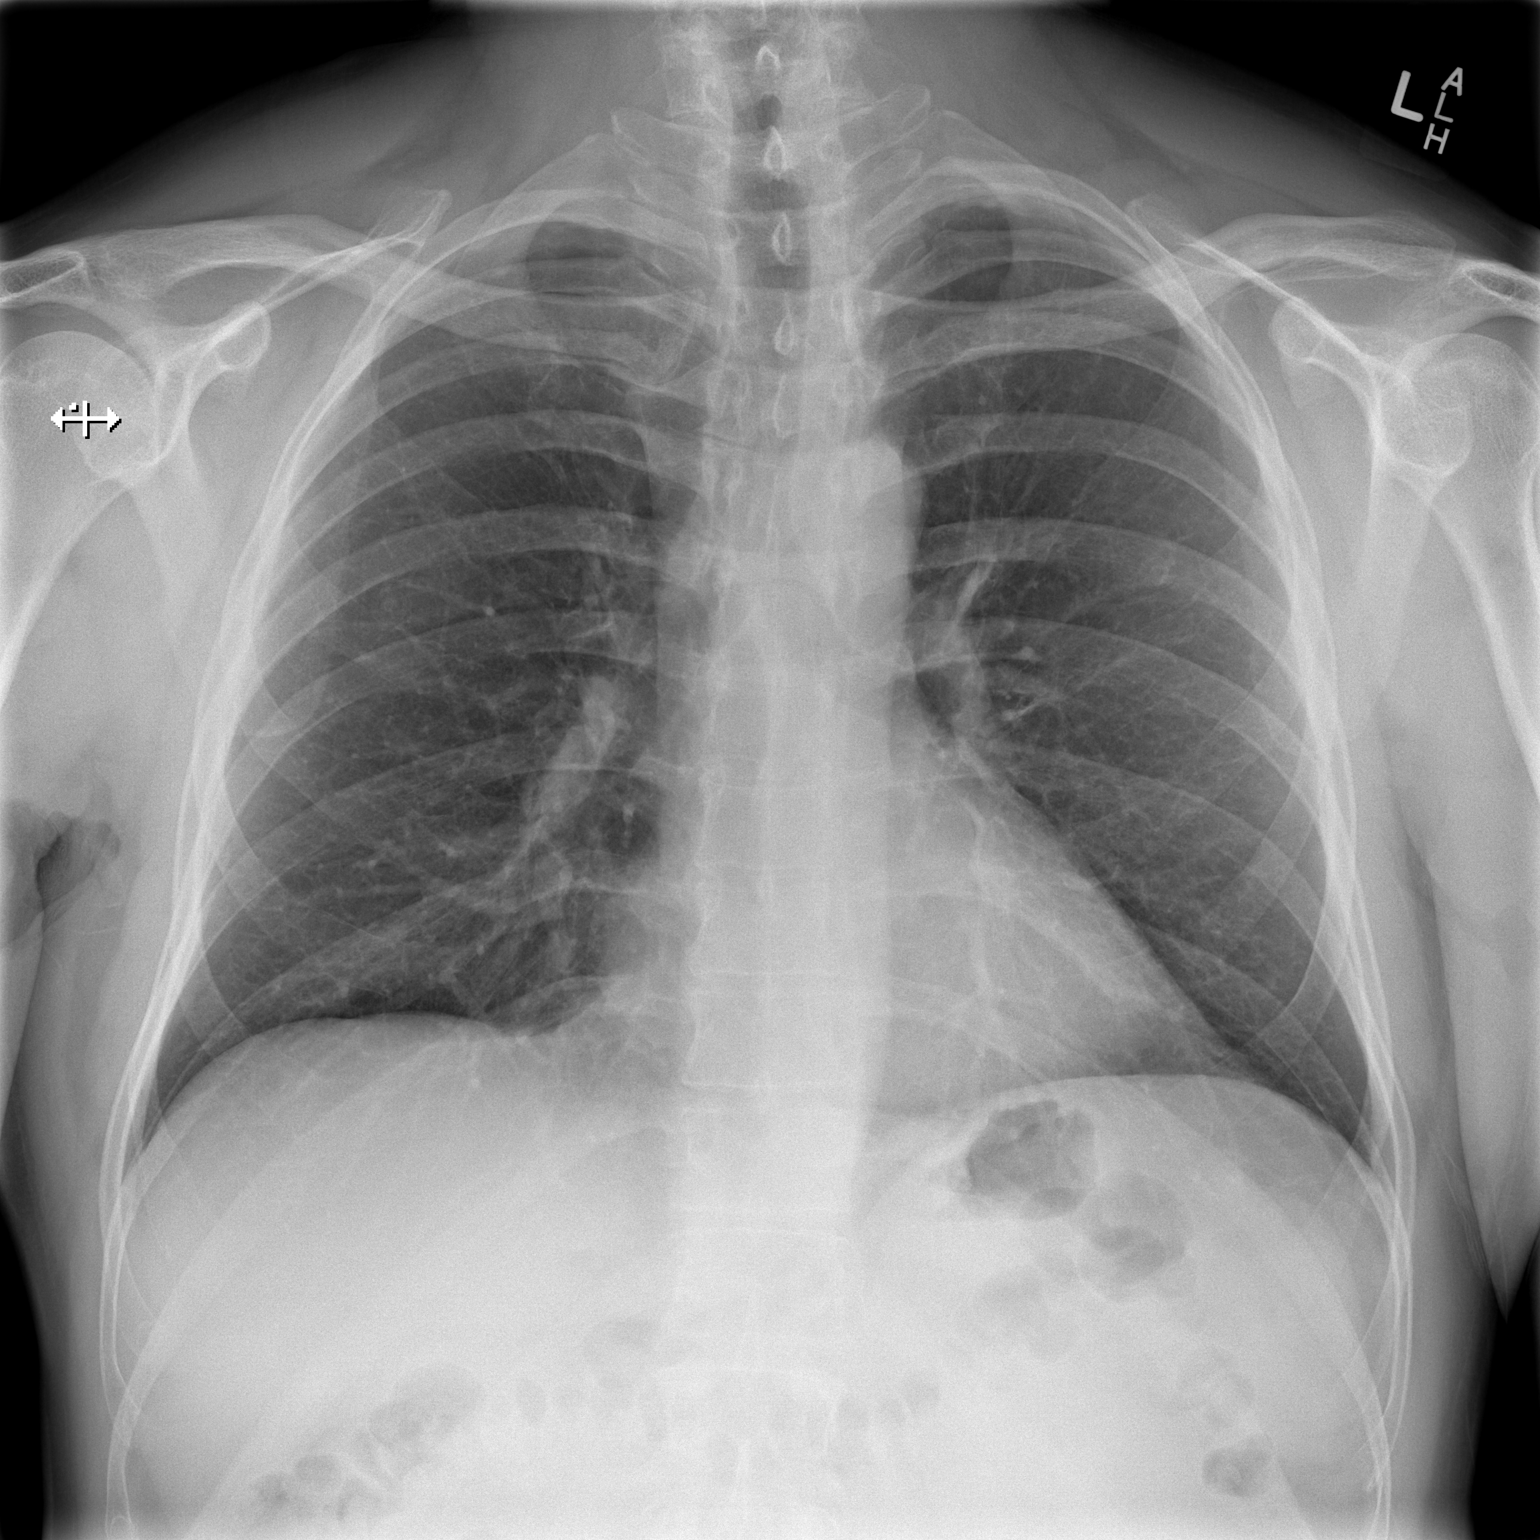

[w chest lat]
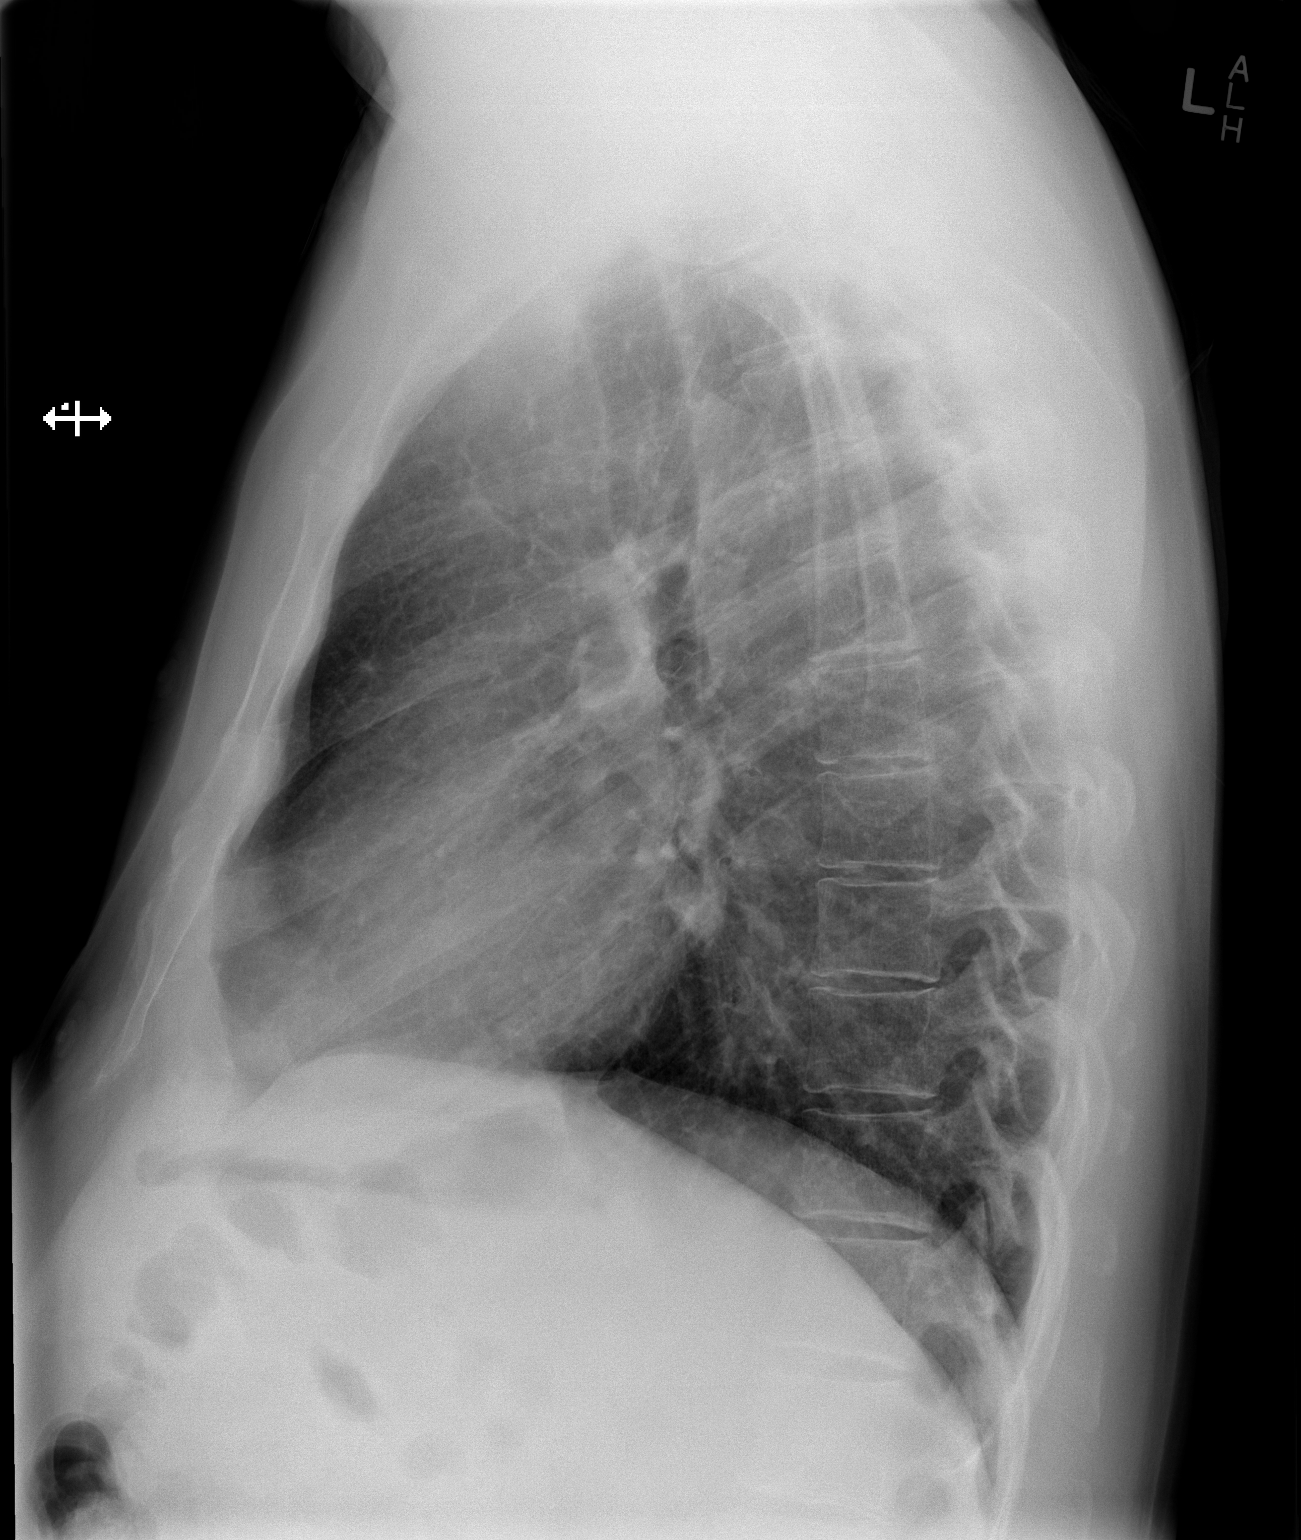

[2 of 2 positions shown; findings below may reference images not displayed]

FINDINGS: Lungs are clear bilaterally. Heart and mediastinum are within normal
limits. The trachea is midline. Negative for a pneumothorax. No
acute bone abnormality.
IMPRESSION: No active cardiopulmonary disease.

## 2015-09-03 ENCOUNTER — Other Ambulatory Visit: Payer: Self-pay | Admitting: Physician Assistant

## 2016-03-24 ENCOUNTER — Other Ambulatory Visit: Payer: Self-pay | Admitting: Interventional Cardiology

## 2016-06-27 ENCOUNTER — Other Ambulatory Visit: Payer: Self-pay | Admitting: Interventional Cardiology

## 2016-06-27 NOTE — Telephone Encounter (Signed)
carvedilol (COREG) 6.25 MG tablet  Medication  Date: 03/27/2016 Department: [facility] Ordering/Authorizing: Jettie Booze, MD  Order Providers   Prescribing Provider Encounter Provider  Jettie Booze, MD Jettie Booze, MD  Medication Detail    Disp Refills Start End   carvedilol (COREG) 6.25 MG tablet 60 tablet 1 03/27/2016    Sig: TAKE 1 TABLET (6.25 MG TOTAL) BY MOUTH 2 (TWO) TIMES DAILY.   Notes to Pharmacy: Please call [phone] to schedule appointment for additional refills thanks. (Atrempt #1 03/27/16)   E-Prescribing Status: Receipt confirmed by pharmacy (03/27/2016 9:36 AM EDT)   Pharmacy   [facility], [place]

## 2016-07-08 ENCOUNTER — Other Ambulatory Visit: Payer: Self-pay | Admitting: Interventional Cardiology

## 2016-07-21 ENCOUNTER — Other Ambulatory Visit: Payer: Self-pay | Admitting: Interventional Cardiology

## 2016-08-16 ENCOUNTER — Other Ambulatory Visit: Payer: Self-pay | Admitting: Interventional Cardiology

## 2016-08-30 ENCOUNTER — Encounter: Payer: Self-pay | Admitting: Interventional Cardiology

## 2016-09-10 ENCOUNTER — Other Ambulatory Visit: Payer: Self-pay | Admitting: Interventional Cardiology

## 2016-09-13 DIAGNOSIS — I252 Old myocardial infarction: Secondary | ICD-10-CM | POA: Insufficient documentation

## 2016-09-13 NOTE — Progress Notes (Signed)
[name], male   DOB: 08-12-1961, 55 y.o.   MRN: [id number]     Cardiology Office Note   Date:  09/14/2016   ID:  Donald Torres, DOB Mar 11, 1961, MRN [id number]  PCP:  Mathews Argyle, MD    No chief complaint on file. old MI   Wt Readings from Last 3 Encounters:  09/14/16 102.1 kg (225 lb)  02/02/15 90.7 kg (200 lb)  09/10/14 90.2 kg (198 lb 12.8 oz)       History of Present Illness: Donald Torres is a 55 y.o. male  with a history of CAD, HTN and tobacco abuse. He was admitted 06/2014 with a non-STEMI. LHC demonstrated a 90% lesion in the LAD with mobile thrombus that was treated with aspiration thrombectomy and a DES. EF was normal.  He is overall doing well. BPs at home avg 130s/80s.Better in the evening usually.  Higher readings are more typically in the morning. The patient denies chest pain, shortness of breath, syncope, orthopnea, PND. He no longer walks 1-2 miles most days of the week. He is watching his salt. He is not smoking cigarettes.  He smoked twice since the last visit under high stress, most recently 2 weeks ago.   He is careful to avoid cutting himself.  No bleeding problems.   He had leg pains that were attributed to lipitor.  This has decreased since stopping lipitor.  He continues to have pains in his legs. The cholesterol has gone up and he is back on atorvastatin.   He checks BP at home at times.  Typically in the [id number] range systolic.     Past Medical History:  Diagnosis Date  . CAD (coronary artery disease)    NSTEMI 06/2014 >>> LHC (9/15):  Mid LAD 90% with mobile thrombus, EF 60% >> PCI:  Aspiration thrombectomy + 2.75 x 28 Promus DES to mid LAD  . High cholesterol   . Hx of echocardiogram    Echo (9/15):  EF 55-60%, no RWMA, Gr 1 DD, mild BAE  . Hypertension     Past Surgical History:  Procedure Laterality Date  . HERNIA REPAIR    . LEFT HEART CATHETERIZATION WITH CORONARY ANGIOGRAM N/A 07/26/2014   Procedure: LEFT  HEART CATHETERIZATION WITH CORONARY ANGIOGRAM;  Surgeon: Jettie Booze, MD;  Location: [facility] CATH LAB;  Service: Cardiovascular;  Laterality: N/A;     Current Outpatient Prescriptions  Medication Sig Dispense Refill  . aspirin EC 81 MG tablet Take 81 mg by mouth daily.    Marland Kitchen buPROPion (WELLBUTRIN XL) 150 MG 24 hr tablet Take 1 tablet by mouth daily.    . carvedilol (COREG) 6.25 MG tablet TAKE 1 TABLET (6.25 MG TOTAL) BY MOUTH TWO (2) TIMES DAILY WITH A MEAL 30 tablet 0  . clopidogrel (PLAVIX) 75 MG tablet Take 1 tablet (75 mg total) by mouth daily. 30 tablet 0  . hydrochlorothiazide (HYDRODIURIL) 25 MG tablet Take 1 tablet by mouth daily.    . irbesartan (AVAPRO) 150 MG tablet TAKE 1 TABLET (150 MG TOTAL) BY MOUTH DAILY. 30 tablet 11  . NITROSTAT 0.4 MG SL tablet Place 1 tablet under the tongue every 5 (five) minutes x 3 doses as needed. For chest pain    . Omega-3 Fatty Acids (FISH OIL PO) Take 1 capsule by mouth daily.    . pravastatin (PRAVACHOL) 80 MG tablet Take 80 mg by mouth daily.     No current facility-administered medications for this visit.  Allergies:   Patient has no known allergies.    Social History:  The patient  reports that he quit smoking about 2 years ago. He has a 36.00 pack-year smoking history. He has never used smokeless tobacco. He reports that he drinks alcohol. He reports that he does not use drugs.   Family History:  The patient's family history includes Cancer in his mother; Diabetes in his brother; Heart attack in his brother and father; Hypertension in his father and mother.    ROS:  Please see the history of present illness.   Otherwise, review of systems are positive for easy nuisance bleeding.   All other systems are reviewed and negative.    PHYSICAL EXAM: VS:  BP 140/82   Pulse 77   Ht 6' (1.829 m)   Wt 102.1 kg (225 lb)   BMI 30.52 kg/m  , BMI Body mass index is 30.52 kg/m. GEN: Well nourished, well developed, in no acute distress    HEENT: normal  Neck: no JVD, carotid bruits, or masses Cardiac: RRR; no murmurs, rubs, or gallops,no edema  Respiratory:  clear to auscultation bilaterally, normal work of breathing GI: soft, nontender, nondistended, + BS MS: no deformity or atrophy  Skin: warm and dry, no rash; 2+ right radial pulse Neuro:  Strength and sensation are intact Psych: euthymic mood, full affect   EKG:  Normal ECG    Recent Labs: No results found for requested labs within last 8760 hours.   Lipid Panel    Component Value Date/Time   CHOL 165 07/18/2015 0924   TRIG 137.0 07/18/2015 0924   HDL 42.00 07/18/2015 0924   CHOLHDL 4 07/18/2015 0924   VLDL 27.4 07/18/2015 0924   LDLCALC 96 07/18/2015 0924     Other studies Reviewed: Additional studies/ records that were reviewed today with results demonstrating: LAD stent in 9/15; Promus DES 28 mm. .   ASSESSMENT AND PLAN:  1. CAD/Old MI: Can Stop aspirin- but given that he had a very thrombotic lesion, will continue or now.  Continue Plavix 75 mg daily. No angina.  No bleeding problems.  Bleeding was worse on Brilinta. 2. Hyperlipidemia: Continue fish oil and atorvastatin.  LDL 96 in 9/16.  Blood test with Dr. Felipa Eth in Dec 2017 for lipid check. 3. HTN: Controlled now.  Continue current meds.  Readings are much better at home than today's in the office. COntinue current meds.   4. Old MI: No signs of CHF.   5. We spoke about a low carb, high protein , high fiber diet to help with weight loss.    Current medicines are reviewed at length with the patient today.  The patient concerns regarding his medicines were addressed.  The following changes have been made:  Will change Brilinta to Plavix in 6 months  Labs/ tests ordered today include: statin panel in 9/16 No orders of the defined types were placed in this encounter.   Recommend 150 minutes/week of aerobic exercise- try a recumbent bike Low fat, low carb, high fiber diet  recommended  Disposition:   FU in 1 year   Signed, Larae Grooms, MD  09/14/2016 8:30 AM    [facility] [address], [place], [place]  [address] Phone: [phone]; Fax: [phone]

## 2016-09-14 ENCOUNTER — Ambulatory Visit (INDEPENDENT_AMBULATORY_CARE_PROVIDER_SITE_OTHER): Payer: BLUE CROSS/BLUE SHIELD | Admitting: Interventional Cardiology

## 2016-09-14 ENCOUNTER — Encounter: Payer: Self-pay | Admitting: Interventional Cardiology

## 2016-09-14 VITALS — BP 140/82 | HR 77 | Ht 72.0 in | Wt 225.0 lb

## 2016-09-14 DIAGNOSIS — I252 Old myocardial infarction: Secondary | ICD-10-CM

## 2016-09-14 DIAGNOSIS — I10 Essential (primary) hypertension: Secondary | ICD-10-CM

## 2016-09-14 DIAGNOSIS — I2510 Atherosclerotic heart disease of native coronary artery without angina pectoris: Secondary | ICD-10-CM

## 2016-09-14 DIAGNOSIS — E785 Hyperlipidemia, unspecified: Secondary | ICD-10-CM | POA: Diagnosis not present

## 2016-09-14 MED ORDER — NITROGLYCERIN 0.4 MG SL SUBL: 0.4000 mg | SUBLINGUAL_TABLET | SUBLINGUAL | 3 refills | Status: DC | PRN

## 2016-09-14 MED ORDER — CARVEDILOL 6.25 MG PO TABS: ORAL_TABLET | ORAL | 11 refills | Status: DC

## 2016-09-14 MED ORDER — HYDROCHLOROTHIAZIDE 25 MG PO TABS
25.0000 mg | ORAL_TABLET | Freq: Every day | ORAL | 11 refills | Status: DC
Start: 2016-09-14 — End: 2017-09-25

## 2016-09-14 MED ORDER — CLOPIDOGREL BISULFATE 75 MG PO TABS: 75.0000 mg | ORAL_TABLET | Freq: Every day | ORAL | 11 refills | Status: DC

## 2016-09-14 NOTE — Patient Instructions (Signed)
**Note De-identified Jakyri Brunkhorst Obfuscation** Medication Instructions:  Same-no changes  Labwork: None  Testing/Procedures: None  Follow-Up: Your physician wants you to follow-up in: 1 year. You will receive a reminder letter in the mail two months in advance. If you don't receive a letter, please call our office to schedule the follow-up appointment.      If you need a refill on your cardiac medications before your next appointment, please call your pharmacy.   

## 2016-09-17 NOTE — Addendum Note (Signed)
Addended by: Campbell Riches on: 09/17/2016 02:08 PM   Modules accepted: Orders

## 2017-05-11 ENCOUNTER — Emergency Department (HOSPITAL_BASED_OUTPATIENT_CLINIC_OR_DEPARTMENT_OTHER): Payer: BLUE CROSS/BLUE SHIELD

## 2017-05-11 ENCOUNTER — Encounter (HOSPITAL_BASED_OUTPATIENT_CLINIC_OR_DEPARTMENT_OTHER): Payer: Self-pay | Admitting: Emergency Medicine

## 2017-05-11 ENCOUNTER — Inpatient Hospital Stay (HOSPITAL_BASED_OUTPATIENT_CLINIC_OR_DEPARTMENT_OTHER)
Admission: EM | Admit: 2017-05-11 | Discharge: 2017-05-13 | DRG: 158 | Disposition: A | Discharge status: Home / Self Care | Payer: BLUE CROSS/BLUE SHIELD | Attending: Internal Medicine | Admitting: Internal Medicine

## 2017-05-11 DIAGNOSIS — E7800 Pure hypercholesterolemia, unspecified: Secondary | ICD-10-CM | POA: Diagnosis present

## 2017-05-11 DIAGNOSIS — K047 Periapical abscess without sinus: Secondary | ICD-10-CM | POA: Diagnosis not present

## 2017-05-11 DIAGNOSIS — I252 Old myocardial infarction: Secondary | ICD-10-CM | POA: Diagnosis not present

## 2017-05-11 DIAGNOSIS — K122 Cellulitis and abscess of mouth: Secondary | ICD-10-CM | POA: Diagnosis present

## 2017-05-11 DIAGNOSIS — I2510 Atherosclerotic heart disease of native coronary artery without angina pectoris: Secondary | ICD-10-CM

## 2017-05-11 DIAGNOSIS — Z87891 Personal history of nicotine dependence: Secondary | ICD-10-CM

## 2017-05-11 DIAGNOSIS — R221 Localized swelling, mass and lump, neck: Secondary | ICD-10-CM | POA: Diagnosis not present

## 2017-05-11 DIAGNOSIS — X58XXXA Exposure to other specified factors, initial encounter: Secondary | ICD-10-CM | POA: Diagnosis present

## 2017-05-11 DIAGNOSIS — I10 Essential (primary) hypertension: Secondary | ICD-10-CM | POA: Diagnosis present

## 2017-05-11 DIAGNOSIS — Z79899 Other long term (current) drug therapy: Secondary | ICD-10-CM

## 2017-05-11 DIAGNOSIS — Z955 Presence of coronary angioplasty implant and graft: Secondary | ICD-10-CM

## 2017-05-11 DIAGNOSIS — E876 Hypokalemia: Secondary | ICD-10-CM | POA: Diagnosis present

## 2017-05-11 DIAGNOSIS — S025XXA Fracture of tooth (traumatic), initial encounter for closed fracture: Secondary | ICD-10-CM

## 2017-05-11 DIAGNOSIS — Z7902 Long term (current) use of antithrombotics/antiplatelets: Secondary | ICD-10-CM

## 2017-05-11 DIAGNOSIS — I340 Nonrheumatic mitral (valve) insufficiency: Secondary | ICD-10-CM | POA: Diagnosis not present

## 2017-05-11 LAB — CBC WITH DIFFERENTIAL/PLATELET
BASOS ABS: 0 10*3/uL (ref 0.0–0.1)
Basophils Relative: 0 %
Eosinophils Absolute: 0 10*3/uL (ref 0.0–0.7)
Eosinophils Relative: 0 %
HCT: 43.5 % (ref 39.0–52.0)
Hemoglobin: 15.1 g/dL (ref 13.0–17.0)
LYMPHS PCT: 7 %
Lymphs Abs: 1 10*3/uL (ref 0.7–4.0)
MCH: 30.6 pg (ref 26.0–34.0)
MCHC: 34.7 g/dL (ref 30.0–36.0)
MCV: 88.1 fL (ref 78.0–100.0)
MONO ABS: 1.2 10*3/uL — AB (ref 0.1–1.0)
Monocytes Relative: 8 %
NEUTROS ABS: 11.8 10*3/uL — AB (ref 1.7–7.7)
Neutrophils Relative %: 85 %
Platelets: 194 10*3/uL (ref 150–400)
RBC: 4.94 MIL/uL (ref 4.22–5.81)
RDW: 14.9 % (ref 11.5–15.5)
WBC: 14.1 10*3/uL — ABNORMAL HIGH (ref 4.0–10.5)

## 2017-05-11 LAB — BASIC METABOLIC PANEL
ANION GAP: 13 (ref 5–15)
BUN: 13 mg/dL (ref 6–20)
CHLORIDE: 103 mmol/L (ref 101–111)
CO2: 22 mmol/L (ref 22–32)
Calcium: 9 mg/dL (ref 8.9–10.3)
Creatinine, Ser: 0.92 mg/dL (ref 0.61–1.24)
GFR calc Af Amer: >60 mL/min (ref >60)
GFR calc non Af Amer: >60 mL/min (ref >60)
GLUCOSE: 98 mg/dL (ref 65–99)
POTASSIUM: 3.4 mmol/L — AB (ref 3.5–5.1)
Sodium: 138 mmol/L (ref 135–145)

## 2017-05-11 LAB — I-STAT CG4 LACTIC ACID, ED
Lactic Acid, Venous: 2.05 mmol/L (ref 0.5–1.9)
Lactic Acid, Venous: 0.95 mmol/L (ref 0.5–1.9)

## 2017-05-11 LAB — RAPID STREP SCREEN (MED CTR MEBANE ONLY): Streptococcus, Group A Screen (Direct): NEGATIVE

## 2017-05-11 LAB — GLUCOSE, CAPILLARY: Glucose-Capillary: 159 mg/dL — ABNORMAL HIGH (ref 65–99)

## 2017-05-11 MED ORDER — SODIUM CHLORIDE 0.9% FLUSH
3.0000 mL | Freq: Two times a day (BID) | INTRAVENOUS | Status: DC
  Administered 2017-05-12 – 2017-05-13 (×2): 3 mL via INTRAVENOUS

## 2017-05-11 MED ORDER — SODIUM CHLORIDE 0.9 % IV SOLN
INTRAVENOUS | Status: DC
  Administered 2017-05-11: 22:00:00 via INTRAVENOUS

## 2017-05-11 MED ORDER — SODIUM CHLORIDE 0.9 % IV SOLN
3.0000 g | Freq: Four times a day (QID) | INTRAVENOUS | Status: DC
  Administered 2017-05-11 – 2017-05-12 (×4): 3 g via INTRAVENOUS
  Filled 2017-05-11 (×6): qty 3

## 2017-05-11 MED ORDER — IRBESARTAN 150 MG PO TABS
300.0000 mg | ORAL_TABLET | Freq: Every day | ORAL | Status: DC
  Administered 2017-05-12 – 2017-05-13 (×2): 300 mg via ORAL
  Filled 2017-05-11 (×2): qty 2

## 2017-05-11 MED ORDER — CLOPIDOGREL BISULFATE 75 MG PO TABS
75.0000 mg | ORAL_TABLET | Freq: Every day | ORAL | Status: DC
  Administered 2017-05-12 – 2017-05-13 (×2): 75 mg via ORAL
  Filled 2017-05-11 (×2): qty 1

## 2017-05-11 MED ORDER — MORPHINE SULFATE (PF) 4 MG/ML IV SOLN
4.0000 mg | Freq: Once | INTRAVENOUS | Status: AC
  Administered 2017-05-11: 4 mg via INTRAVENOUS
  Filled 2017-05-11: qty 1

## 2017-05-11 MED ORDER — IOPAMIDOL (ISOVUE-300) INJECTION 61%
100.0000 mL | Freq: Once | INTRAVENOUS | Status: AC | PRN
  Administered 2017-05-11: 100 mL via INTRAVENOUS

## 2017-05-11 MED ORDER — MORPHINE SULFATE (PF) 2 MG/ML IV SOLN: 2.0000 mg | INTRAVENOUS | Status: DC | PRN

## 2017-05-11 MED ORDER — HEPARIN SODIUM (PORCINE) 5000 UNIT/ML IJ SOLN
5000.0000 [IU] | Freq: Three times a day (TID) | INTRAMUSCULAR | Status: DC
  Administered 2017-05-12: 5000 [IU] via SUBCUTANEOUS
  Filled 2017-05-11: qty 1

## 2017-05-11 MED ORDER — BUPROPION HCL ER (XL) 150 MG PO TB24
150.0000 mg | ORAL_TABLET | Freq: Every day | ORAL | Status: DC
  Administered 2017-05-12 – 2017-05-13 (×2): 150 mg via ORAL
  Filled 2017-05-11 (×2): qty 1

## 2017-05-11 MED ORDER — DEXAMETHASONE SODIUM PHOSPHATE 10 MG/ML IJ SOLN
10.0000 mg | Freq: Once | INTRAMUSCULAR | Status: AC
  Administered 2017-05-11: 10 mg via INTRAVENOUS

## 2017-05-11 MED ORDER — ONDANSETRON HCL 4 MG/2ML IJ SOLN: 4.0000 mg | Freq: Four times a day (QID) | INTRAMUSCULAR | Status: DC | PRN

## 2017-05-11 MED ORDER — AMLODIPINE BESYLATE 5 MG PO TABS
5.0000 mg | ORAL_TABLET | Freq: Every day | ORAL | Status: DC
  Administered 2017-05-12: 5 mg via ORAL
  Filled 2017-05-11 (×2): qty 1

## 2017-05-11 MED ORDER — ONDANSETRON HCL 4 MG PO TABS: 4.0000 mg | ORAL_TABLET | Freq: Four times a day (QID) | ORAL | Status: DC | PRN

## 2017-05-11 MED ORDER — HYDROCHLOROTHIAZIDE 25 MG PO TABS
25.0000 mg | ORAL_TABLET | Freq: Every day | ORAL | Status: DC
  Administered 2017-05-12 – 2017-05-13 (×2): 25 mg via ORAL
  Filled 2017-05-11 (×2): qty 1

## 2017-05-11 MED ORDER — EZETIMIBE 10 MG PO TABS
10.0000 mg | ORAL_TABLET | Freq: Every day | ORAL | Status: DC
  Administered 2017-05-12 – 2017-05-13 (×2): 10 mg via ORAL
  Filled 2017-05-11 (×2): qty 1

## 2017-05-11 MED ORDER — MUPIROCIN 2 % EX OINT: 1.0000 "application " | TOPICAL_OINTMENT | Freq: Two times a day (BID) | CUTANEOUS | Status: DC

## 2017-05-11 MED ORDER — DEXAMETHASONE SODIUM PHOSPHATE 10 MG/ML IJ SOLN
10.0000 mg | Freq: Once | INTRAMUSCULAR | Status: DC
  Filled 2017-05-11: qty 1

## 2017-05-11 MED ORDER — ACETAMINOPHEN 650 MG RE SUPP: 650.0000 mg | Freq: Four times a day (QID) | RECTAL | Status: DC | PRN

## 2017-05-11 MED ORDER — ACETAMINOPHEN 325 MG PO TABS: 650.0000 mg | ORAL_TABLET | Freq: Four times a day (QID) | ORAL | Status: DC | PRN

## 2017-05-11 MED ORDER — POLYETHYLENE GLYCOL 3350 17 G PO PACK: 17.0000 g | PACK | Freq: Every day | ORAL | Status: DC | PRN

## 2017-05-11 MED ORDER — CHLORHEXIDINE GLUCONATE CLOTH 2 % EX PADS: 6.0000 | MEDICATED_PAD | Freq: Every day | CUTANEOUS | Status: DC

## 2017-05-11 MED ORDER — PRAVASTATIN SODIUM 40 MG PO TABS
80.0000 mg | ORAL_TABLET | Freq: Every day | ORAL | Status: DC
  Administered 2017-05-12 – 2017-05-13 (×2): 80 mg via ORAL
  Filled 2017-05-11 (×2): qty 2

## 2017-05-11 NOTE — ED Notes (Signed)
Attempted x 2 to obtain IV, blood x 2; unsuccessful.

## 2017-05-11 NOTE — ED Provider Notes (Signed)
[facility] DEPT [facility] Provider Note   CSN: [id number] Arrival date & time: 05/11/17  0950     History   Chief Complaint Chief Complaint  Patient presents with  . Facial Swelling    HPI Donald Torres is a 56 y.o. male who presents to the emergency department with right-sided throat swelling. The patient reports that he broke a tooth about 2 weeks ago, but does not have dental insurance so he did not follow-up with a dentist. He reports the pain has been constant for the last 2 weeks, but significantly worsened over the last 3 days. He also reports right-sided throat and jaw swelling that has significantly worsened over the last 3 days. He reports associated right-sided facial pain that radiates up to his right ear. No fever or chills. He reports that he has not been able to eat much over the last 3 days due to the pain. He denies dyspnea, dysphasia, drooling, or trismus. He reports that he was seen at urgent care this morning and was advised to come to the emergency department for further workup and evaluation. PMH includes CAD s/p coronary stent x 1.   The history is provided by the patient. No language interpreter was used.    Past Medical History:  Diagnosis Date  . CAD (coronary artery disease)    NSTEMI 06/2014 >>> LHC (9/15):  Mid LAD 90% with mobile thrombus, EF 60% >> PCI:  Aspiration thrombectomy + 2.75 x 28 Promus DES to mid LAD  . High cholesterol   . Hx of echocardiogram    Echo (9/15):  EF 55-60%, no RWMA, Gr 1 DD, mild BAE  . Hypertension     Patient Active Problem List   Diagnosis Date Noted  . Neck swelling 05/11/2017  . Old MI (myocardial infarction) 09/13/2016  . Coronary arteriosclerosis 02/02/2015  . NSTEMI (non-ST elevated myocardial infarction) ([place]) 07/23/2014  . HTN (hypertension), benign 07/23/2014  . Dyslipidemia 07/23/2014  . Tobacco abuse 07/23/2014    Past Surgical History:  Procedure Laterality Date  . HERNIA REPAIR    . LEFT HEART  CATHETERIZATION WITH CORONARY ANGIOGRAM N/A 07/26/2014   Procedure: LEFT HEART CATHETERIZATION WITH CORONARY ANGIOGRAM;  Surgeon: Jettie Booze, MD;  Location: [facility] CATH LAB;  Service: Cardiovascular;  Laterality: N/A;       Home Medications    Prior to Admission medications   Medication Sig Start Date End Date Taking? Authorizing Provider  amLODipine (NORVASC) 5 MG tablet Take 5 mg by mouth daily.   Yes [provider]  ezetimibe (ZETIA) 10 MG tablet Take 10 mg by mouth daily.   Yes [provider]  aspirin EC 81 MG tablet Take 81 mg by mouth daily.    [provider]  buPROPion (WELLBUTRIN XL) 150 MG 24 hr tablet Take 1 tablet by mouth daily. 09/03/16   [provider]  carvedilol (COREG) 6.25 MG tablet TAKE 1 TABLET (6.25 MG TOTAL) BY MOUTH TWO (2) TIMES DAILY WITH A MEAL 09/14/16   Jettie Booze, MD  clopidogrel (PLAVIX) 75 MG tablet Take 1 tablet (75 mg total) by mouth daily. 09/14/16   Jettie Booze, MD  hydrochlorothiazide (HYDRODIURIL) 25 MG tablet Take 1 tablet (25 mg total) by mouth daily. 09/14/16   Jettie Booze, MD  irbesartan (AVAPRO) 150 MG tablet TAKE 1 TABLET (150 MG TOTAL) BY MOUTH DAILY. Patient taking differently: TAKE 1 TABLET (300 MG TOTAL) BY MOUTH DAILY. 03/24/15   Jettie Booze, MD  nitroGLYCERIN (NITROSTAT) 0.4 MG SL tablet Place 1 tablet (0.4 mg total) under the tongue every 5 (five) minutes x 3 doses as needed. For chest pain 09/14/16   Jettie Booze, MD  Omega-3 Fatty Acids (FISH OIL PO) Take 1,000 mg by mouth daily.     [provider]  pravastatin (PRAVACHOL) 80 MG tablet Take 80 mg by mouth daily.    [provider]    Family History Family History  Problem Relation Age of Onset  . Heart attack Father   . Hypertension Father   . Heart attack Brother   . Diabetes Brother   . Cancer Mother   . Hypertension Mother   . Heart attack Unknown     Social  History Social History  Substance Use Topics  . Smoking status: Former Smoker    Packs/day: 1.00    Years: 36.00    Quit date: 07/23/2014  . Smokeless tobacco: Never Used  . Alcohol use Yes     Allergies   Patient has no known allergies.   Review of Systems Review of Systems  Constitutional: Negative for chills and fever.  HENT: Positive for dental problem, facial swelling and sore throat. Negative for drooling, ear pain, trouble swallowing and voice change.    Physical Exam Updated Vital Signs BP 133/76   Pulse 88   Temp 99.6 F (37.6 C) (Oral)   Resp 15   Ht 6' (1.829 m)   Wt 98.9 kg (218 lb)   SpO2 96%   BMI 29.57 kg/m   Physical Exam  Constitutional: He appears well-developed.  HENT:  Head: Normocephalic.  Poor dentition throughout with severe periodontal disease. Diffuse swelling and induration over the right anterior neck. No left-sided swelling. Uvula is midline. Gingiva are nontender. No tenderness to palpation over the teeth.  Eyes: Conjunctivae are normal.  Neck: Neck supple.  No stridor.  Cardiovascular: Normal rate, regular rhythm, normal heart sounds and intact distal pulses.  Exam reveals no gallop and no friction rub.   No murmur heard. Pulmonary/Chest: Effort normal and breath sounds normal. No respiratory distress. He has no wheezes. He has no rales.  Abdominal: Soft. Bowel sounds are normal. He exhibits no distension. There is no tenderness. There is no rebound and no guarding.  Neurological: He is alert.  Skin: Skin is warm and dry.  Psychiatric: His behavior is normal.  Nursing note and vitals reviewed.  ED Treatments / Results  Labs (all labs ordered are listed, but only abnormal results are displayed) Labs Reviewed  CBC WITH DIFFERENTIAL/PLATELET - Abnormal; Notable for the following:       Result Value   WBC 14.1 (*)    Neutro Abs 11.8 (*)    Monocytes Absolute 1.2 (*)    All other components within normal limits  BASIC METABOLIC  PANEL - Abnormal; Notable for the following:    Potassium 3.4 (*)    All other components within normal limits  I-STAT CG4 LACTIC ACID, ED - Abnormal; Notable for the following:    Lactic Acid, Venous 2.05 (*)    All other components within normal limits  RAPID STREP SCREEN (NOT AT [facility])  CULTURE, GROUP A STREP ([place])  I-STAT CG4 LACTIC ACID, ED    EKG  EKG Interpretation None       Radiology Ct Soft Tissue Neck W Contrast  Result Date: 05/11/2017 CLINICAL DATA:  Patient cracked tooth 2 weeks ago. Pain and swelling with induration to the RIGHT neck. Pain with swallowing.  Elevated white count. Negative strep test. EXAM: CT NECK WITH CONTRAST TECHNIQUE: Multidetector CT imaging of the neck was performed using the standard protocol following the bolus administration of intravenous contrast. CONTRAST:  184mL ISOVUE-300 IOPAMIDOL (ISOVUE-300) INJECTION 61% COMPARISON:  None. FINDINGS: Pharynx and larynx: Clinically, ordering provider reports poor dentition. Assessment with CT is difficult due to beam hardening, however RIGHT mandibular periapical lucencies are suggested, along with a possible dental caries of a RIGHT mandibular molar. Edema in the region of the RIGHT base of tongue, with mass effect on the RIGHT hypopharynx, surrounding the RIGHT submandibular gland, with stranding of the fat and RIGHT platysma. Slight effacement of the airway is noted from RIGHT to LEFT, for instance image 48 series 3 There is no discrete abscess. Overall the findings are most consistent with dental related floor of mouth inflammatory phlegmon. If untreated, further swelling and abscess could develop leading to airway compromise and Ludwig's angina. Salivary glands: No intrinsic inflammation, mass, or stone. Thyroid: There is a large thyroid nodule, partially calcified, measuring 20 x 24 x 30 mm. Further evaluation with sonography and tissue sampling is warranted. Lymph nodes: Reactive lymphadenopathy in level 1 and  level 2, greater on the RIGHT Vascular: Patent. Limited intracranial: Negative. Visualized orbits: Negative. Mastoids and visualized paranasal sinuses: Negative. Skeleton: Spondylosis.  No worrisome osseous findings. Upper chest: Negative. Other: None. IMPRESSION: In this patient with poor dentition, the mass effect observed in the RIGHT floor of mouth, involving the submandibular, and parapharyngeal spaces primarily, with edema extending into the base of tongue is most consistent with a dental related inflammatory phlegmon. No abscess is seen. ENT consultation is warranted, to monitor closely for airway compromise and/or development of Ludwig's angina. Findings discussed with ordering provider. 2 x 2.5 x 3 cm RIGHT thyroid nodule.  Tissue sampling is warranted. Electronically Signed   By: Staci Righter M.D.   On: 05/11/2017 12:54    Procedures Procedures (including critical care time)  Medications Ordered in ED Medications  Ampicillin-Sulbactam (UNASYN) 3 g in sodium chloride 0.9 % 100 mL IVPB (0 g Intravenous Stopped 05/11/17 1525)  dexamethasone (DECADRON) injection 10 mg (10 mg Intravenous Given 05/11/17 1135)  morphine 4 MG/ML injection 4 mg (4 mg Intravenous Given 05/11/17 1140)  iopamidol (ISOVUE-300) 61 % injection 100 mL (100 mLs Intravenous Contrast Given 05/11/17 1157)  morphine 4 MG/ML injection 4 mg (4 mg Intravenous Given 05/11/17 1705)     Initial Impression / Assessment and Plan / ED Course  I have reviewed the triage vital signs and the nursing notes.  Pertinent labs & imaging results that were available during my care of the patient were reviewed by me and considered in my medical decision making (see chart for details).     The patient was seen and evaluated by Dr. Sherry Ruffing, attending physician. Lactate 2.05, improving to 0.95. Leukocytosis of 14.1. CT soft tissues of the neck does not demonstrate an abscess, but radiology recommends ENT consultation to monitor closely for airway  compromise and/or development of Ludwig angina. Mass effect observed in the right floor of the mouth involving the submandibular and parapharyngeal spaces with edema extending into the base of the tongue. Consulted ENT who recommended admission to [facility] and consult to dentistry. Consult to dentistry, Dr. Mariel Sleet, who is out of town for the weekend. Spoke to the on-call dentist, Dr. Mariea Clonts for the Dr. [facility] Saran private practice who recommended an oral surgery consult to possibly relieve pressure with extraction of the tooth. Consulted Dr.  Hoyt Koch with oral surgery who will see the patient after they are transferred to count. The patient's airway as stable at this time. Vital signs stable. No acute distress. Patient care transferred to [place] at the end of my shift. Patient presentation, ED course, and plan of care discussed with review of all pertinent labs and imaging. Please see her note for further details regarding further ED course and disposition.   Final Clinical Impressions(s) / ED Diagnoses   Final diagnoses:  None    New Prescriptions New Prescriptions   No medications on file     Joanne Gavel, PA-C 05/11/17 1707    Tegeler, Gwenyth Allegra, MD 05/14/17 1023

## 2017-05-11 NOTE — ED Notes (Signed)
Assumed care of patient from [place], [place]. PT resting quietly. No distress. In process of obtaining labs and IV access.

## 2017-05-11 NOTE — ED Notes (Signed)
Pt inquiring about his extended ED wait time and waiting for admission to [facility]. Spoke to [employer], Agricultural consultant and Iona Beard, [place] called for update, [facility] states "it will be a while, he has 2 people ahead of him, it may be late this evening, but should definitely be by tonight." Updated patient, comfort measures provided. Charge RN updated.

## 2017-05-11 NOTE — ED Triage Notes (Signed)
Pt c/o pain and swelling to RT lower jaw x 2 wks; worse since Thursday

## 2017-05-11 NOTE — Plan of Care (Signed)
56yo M CC: L neck swelling  CT: shows swelling of R jaw and neck, mass effect in the right floor the mouth  Dr. Benjamine Mola - ENT will see pt per ED PA Per ED PA no resp distress ENT recommended Dental consult  ED PA to call dental, confirm consultation on arrival  ED attending recommend stepdown bed Patient needs close observation for possible airway compromise  Patient hasn't really received antibiotics.  Ordered: Unasyn 3g q6, cardiac monitoring, cont pulse ox  Status: SDU, inpt  Elwin Mocha MD

## 2017-05-11 NOTE — H&P (Signed)
History and Physical    Donald Torres GXQ:[id number] DOB: 05-18-1961 DOA: 05/11/2017  PCP: Donald Manes, MD  Patient coming from: [facility]  I have personally briefly reviewed patient's old medical records in [place]  Chief Complaint: Neck swelling  HPI: Donald Torres is a 56 y.o. male with medical history significant of CAD with NSTEMI in 2015, DES to LAD at that time, no further stents since that time; HTN; HLD, normal EF.  Patient presents to the ED at [facility] with c/o R sided throat and neck swelling.  Broke a tooth about 2 weeks ago, doesn't have dental insurance.  Pain constant for last 2 weeks but significant worsening over last 3 days.  R sided throat and jaw swelling worse over last 3 days.  No fevers nor chills.   ED Course: CT reveals infection / abscess coming from tooth in lower mandible.  Patient put on unasyn and given dose of dexamethasone.  Transferred to [facility] after they consulted oral surgery.   Review of Systems: As per HPI otherwise 10 point review of systems negative.   Past Medical History:  Diagnosis Date  . CAD (coronary artery disease)    NSTEMI 06/2014 >>> LHC (9/15):  Mid LAD 90% with mobile thrombus, EF 60% >> PCI:  Aspiration thrombectomy + 2.75 x 28 Promus DES to mid LAD  . High cholesterol   . Hx of echocardiogram    Echo (9/15):  EF 55-60%, no RWMA, Gr 1 DD, mild BAE  . Hypertension     Past Surgical History:  Procedure Laterality Date  . HERNIA REPAIR    . LEFT HEART CATHETERIZATION WITH CORONARY ANGIOGRAM N/A 07/26/2014   Procedure: LEFT HEART CATHETERIZATION WITH CORONARY ANGIOGRAM;  Surgeon: Donald Booze, MD;  Location: [facility] CATH LAB;  Service: Cardiovascular;  Laterality: N/A;     reports that he quit smoking about 2 years ago. He has a 36.00 pack-year smoking history. He has never used smokeless tobacco. He reports that he drinks alcohol. He reports that he does not use drugs.  No Known Allergies  Family History  Problem Relation  Age of Onset  . Heart attack Father   . Hypertension Father   . Heart attack Brother   . Diabetes Brother   . Cancer Mother   . Hypertension Mother   . Heart attack Unknown      Prior to Admission medications   Medication Sig Start Date End Date Taking? Authorizing Provider  amLODipine (NORVASC) 5 MG tablet Take 5 mg by mouth daily.   Yes [provider]  ezetimibe (ZETIA) 10 MG tablet Take 10 mg by mouth daily.   Yes [provider]  buPROPion (WELLBUTRIN XL) 150 MG 24 hr tablet Take 1 tablet by mouth daily. 09/03/16   [provider]  clopidogrel (PLAVIX) 75 MG tablet Take 1 tablet (75 mg total) by mouth daily. 09/14/16   Donald Booze, MD  hydrochlorothiazide (HYDRODIURIL) 25 MG tablet Take 1 tablet (25 mg total) by mouth daily. 09/14/16   Donald Booze, MD  irbesartan (AVAPRO) 150 MG tablet TAKE 1 TABLET (150 MG TOTAL) BY MOUTH DAILY. Patient taking differently: TAKE 1 TABLET (300 MG TOTAL) BY MOUTH DAILY. 03/24/15   Donald Booze, MD  nitroGLYCERIN (NITROSTAT) 0.4 MG SL tablet Place 1 tablet (0.4 mg total) under the tongue every 5 (five) minutes x 3 doses as needed. For chest pain 09/14/16   Donald Booze, MD  Omega-3 Fatty Acids (FISH OIL  PO) Take 1,000 mg by mouth daily.     [provider]  pravastatin (PRAVACHOL) 80 MG tablet Take 80 mg by mouth daily.    [provider]    Physical Exam: Vitals:   05/11/17 1730 05/11/17 1745 05/11/17 1800 05/11/17 1918  BP: 119/70  110/70 (!) 145/75  Pulse: 94 86 81 99  Resp: (!) 21 14 18 18   Temp:    98.2 F (36.8 C)  TempSrc:    Oral  SpO2: 97% 96% 96% 98%  Weight:    96.7 kg (213 lb 1.6 oz)  Height:    6' (1.829 m)    Constitutional: NAD, calm, comfortable Eyes: PERRL, lids and conjunctivae normal ENMT: Mucous membranes are moist. Posterior pharynx clear of any exudate or lesions.Normal dentition.  Neck: Neck swelling noted. Respiratory: clear to  auscultation bilaterally, no wheezing, no crackles. Normal respiratory effort. No accessory muscle use.  Cardiovascular: Regular rate and rhythm, no murmurs / rubs / gallops. No extremity edema. 2+ pedal pulses. No carotid bruits.  Abdomen: no tenderness, no masses palpated. No hepatosplenomegaly. Bowel sounds positive.  Musculoskeletal: no clubbing / cyanosis. No joint deformity upper and lower extremities. Good ROM, no contractures. Normal muscle tone.  Skin: no rashes, lesions, ulcers. No induration Neurologic: CN 2-12 grossly intact. Sensation intact, DTR normal. Strength 5/5 in all 4.  Psychiatric: Normal judgment and insight. Alert and oriented x 3. Normal mood.    Labs on Admission: I have personally reviewed following labs and imaging studies  CBC:  Recent Labs Lab 05/11/17 1110  WBC 14.1*  NEUTROABS 11.8*  HGB 15.1  HCT 43.5  MCV 88.1  PLT 948   Basic Metabolic Panel:  Recent Labs Lab 05/11/17 1110  NA 138  K 3.4*  CL 103  CO2 22  GLUCOSE 98  BUN 13  CREATININE 0.92  CALCIUM 9.0   GFR: Estimated Creatinine Clearance: 110.6 mL/min (by C-G formula based on SCr of 0.92 mg/dL). Liver Function Tests: No results for input(s): AST, ALT, ALKPHOS, BILITOT, PROT, ALBUMIN in the last 168 hours. No results for input(s): LIPASE, AMYLASE in the last 168 hours. No results for input(s): AMMONIA in the last 168 hours. Coagulation Profile: No results for input(s): INR, PROTIME in the last 168 hours. Cardiac Enzymes: No results for input(s): CKTOTAL, CKMB, CKMBINDEX, TROPONINI in the last 168 hours. BNP (last 3 results) No results for input(s): PROBNP in the last 8760 hours. HbA1C: No results for input(s): HGBA1C in the last 72 hours. CBG:  Recent Labs Lab 05/11/17 2002  GLUCAP 159*   Lipid Profile: No results for input(s): CHOL, HDL, LDLCALC, TRIG, CHOLHDL, LDLDIRECT in the last 72 hours. Thyroid Function Tests: No results for input(s): TSH, T4TOTAL, FREET4,  T3FREE, THYROIDAB in the last 72 hours. Anemia Panel: No results for input(s): VITAMINB12, FOLATE, FERRITIN, TIBC, IRON, RETICCTPCT in the last 72 hours. Urine analysis: No results found for: COLORURINE, APPEARANCEUR, [place], [place], GLUCOSEU, HGBUR, BILIRUBINUR, KETONESUR, PROTEINUR, UROBILINOGEN, NITRITE, LEUKOCYTESUR  Radiological Exams on Admission: Ct Soft Tissue Neck W Contrast  Result Date: 05/11/2017 CLINICAL DATA:  Patient cracked tooth 2 weeks ago. Pain and swelling with induration to the RIGHT neck. Pain with swallowing. Elevated white count. Negative strep test. EXAM: CT NECK WITH CONTRAST TECHNIQUE: Multidetector CT imaging of the neck was performed using the standard protocol following the bolus administration of intravenous contrast. CONTRAST:  155mL ISOVUE-300 IOPAMIDOL (ISOVUE-300) INJECTION 61% COMPARISON:  None. FINDINGS: Pharynx and larynx: Clinically, ordering provider reports poor dentition.  Assessment with CT is difficult due to beam hardening, however RIGHT mandibular periapical lucencies are suggested, along with a possible dental caries of a RIGHT mandibular molar. Edema in the region of the RIGHT base of tongue, with mass effect on the RIGHT hypopharynx, surrounding the RIGHT submandibular gland, with stranding of the fat and RIGHT platysma. Slight effacement of the airway is noted from RIGHT to LEFT, for instance image 48 series 3 There is no discrete abscess. Overall the findings are most consistent with dental related floor of mouth inflammatory phlegmon. If untreated, further swelling and abscess could develop leading to airway compromise and Ludwig's angina. Salivary glands: No intrinsic inflammation, mass, or stone. Thyroid: There is a large thyroid nodule, partially calcified, measuring 20 x 24 x 30 mm. Further evaluation with sonography and tissue sampling is warranted. Lymph nodes: Reactive lymphadenopathy in level 1 and level 2, greater on the RIGHT Vascular: Patent.  Limited intracranial: Negative. Visualized orbits: Negative. Mastoids and visualized paranasal sinuses: Negative. Skeleton: Spondylosis.  No worrisome osseous findings. Upper chest: Negative. Other: None. IMPRESSION: In this patient with poor dentition, the mass effect observed in the RIGHT floor of mouth, involving the submandibular, and parapharyngeal spaces primarily, with edema extending into the base of tongue is most consistent with a dental related inflammatory phlegmon. No abscess is seen. ENT consultation is warranted, to monitor closely for airway compromise and/or development of Ludwig's angina. Findings discussed with ordering provider. 2 x 2.5 x 3 cm RIGHT thyroid nodule.  Tissue sampling is warranted. Electronically Signed   By: Staci Righter M.D.   On: 05/11/2017 12:54    EKG: Independently reviewed.  Assessment/Plan Principal Problem:   Neck swelling Active Problems:   HTN (hypertension), benign   Coronary arteriosclerosis    1. Dental infection with neck swelling - 1. Improved after ABx and steroids 2. Per Dr. Benjamine Mola: 1. Continue ABx no need for further steroids 2. Patient may eat regular diet 3. Call oral surgery as they will need to be ones to fix abscess / tooth 3. Call oral surgery back in AM (takes call from 7a-7p) 4. Will continue unasyn 5. Will allow patient to eat tonight 6. IVF: NS at 75 cc/hr 2. CAD - continue ASA and plavix for now 3. HTN - continue home meds  DVT prophylaxis: Heparin [place] Code Status: Full Family Communication: Wife at bedside Disposition Plan: Home after admit Consults called: Messaged Dr. Benjamine Mola as noted above.  EDP spoke with Oral surgery. Admission status: Admit to inpatient   [place], [place] [facility] Pager [phone]  If 7AM-7PM, please contact day team taking care of patient [email or web address] Password [facility]  05/11/2017, 9:27 PM

## 2017-05-12 ENCOUNTER — Inpatient Hospital Stay (HOSPITAL_COMMUNITY): Payer: BLUE CROSS/BLUE SHIELD

## 2017-05-12 DIAGNOSIS — R221 Localized swelling, mass and lump, neck: Secondary | ICD-10-CM

## 2017-05-12 DIAGNOSIS — S025XXA Fracture of tooth (traumatic), initial encounter for closed fracture: Secondary | ICD-10-CM

## 2017-05-12 DIAGNOSIS — I340 Nonrheumatic mitral (valve) insufficiency: Secondary | ICD-10-CM

## 2017-05-12 DIAGNOSIS — E876 Hypokalemia: Secondary | ICD-10-CM

## 2017-05-12 DIAGNOSIS — K047 Periapical abscess without sinus: Principal | ICD-10-CM

## 2017-05-12 DIAGNOSIS — K122 Cellulitis and abscess of mouth: Secondary | ICD-10-CM

## 2017-05-12 DIAGNOSIS — I10 Essential (primary) hypertension: Secondary | ICD-10-CM

## 2017-05-12 LAB — BASIC METABOLIC PANEL
ANION GAP: 9 (ref 5–15)
Anion gap: 8 (ref 5–15)
BUN: 19 mg/dL (ref 6–20)
BUN: 19 mg/dL (ref 6–20)
CALCIUM: 8.8 mg/dL — AB (ref 8.9–10.3)
CO2: 25 mmol/L (ref 22–32)
CO2: 26 mmol/L (ref 22–32)
Calcium: 9 mg/dL (ref 8.9–10.3)
Chloride: 104 mmol/L (ref 101–111)
Chloride: 103 mmol/L (ref 101–111)
Creatinine, Ser: 0.98 mg/dL (ref 0.61–1.24)
Creatinine, Ser: 0.94 mg/dL (ref 0.61–1.24)
GFR calc Af Amer: >60 mL/min (ref >60)
GFR calc Af Amer: >60 mL/min (ref >60)
GLUCOSE: 159 mg/dL — AB (ref 65–99)
GLUCOSE: 90 mg/dL (ref 65–99)
POTASSIUM: 3.8 mmol/L (ref 3.5–5.1)
Potassium: 3.3 mmol/L — ABNORMAL LOW (ref 3.5–5.1)
Sodium: 137 mmol/L (ref 135–145)
Sodium: 138 mmol/L (ref 135–145)

## 2017-05-12 LAB — CBC WITH DIFFERENTIAL/PLATELET
BASOS PCT: 0 %
Basophils Absolute: 0 10*3/uL (ref 0.0–0.1)
EOS ABS: 0 10*3/uL (ref 0.0–0.7)
Eosinophils Relative: 0 %
HCT: 40.8 % (ref 39.0–52.0)
HEMOGLOBIN: 13.5 g/dL (ref 13.0–17.0)
LYMPHS ABS: 1 10*3/uL (ref 0.7–4.0)
Lymphocytes Relative: 6 %
MCH: 29.7 pg (ref 26.0–34.0)
MCHC: 33.1 g/dL (ref 30.0–36.0)
MCV: 89.9 fL (ref 78.0–100.0)
MONO ABS: 0.9 10*3/uL (ref 0.1–1.0)
MONOS PCT: 5 %
Neutro Abs: 16.3 10*3/uL — ABNORMAL HIGH (ref 1.7–7.7)
Neutrophils Relative %: 89 %
Platelets: 240 10*3/uL (ref 150–400)
RBC: 4.54 MIL/uL (ref 4.22–5.81)
RDW: 14.9 % (ref 11.5–15.5)
WBC: 18.3 10*3/uL — ABNORMAL HIGH (ref 4.0–10.5)

## 2017-05-12 LAB — GLUCOSE, CAPILLARY
GLUCOSE-CAPILLARY: 181 mg/dL — AB (ref 65–99)
GLUCOSE-CAPILLARY: 123 mg/dL — AB (ref 65–99)
GLUCOSE-CAPILLARY: 191 mg/dL — AB (ref 65–99)
GLUCOSE-CAPILLARY: 160 mg/dL — AB (ref 65–99)
Glucose-Capillary: 90 mg/dL (ref 65–99)

## 2017-05-12 LAB — PROTIME-INR
INR: 1.07
PROTHROMBIN TIME: 13.9 s (ref 11.4–15.2)

## 2017-05-12 LAB — MAGNESIUM: Magnesium: 2.2 mg/dL (ref 1.7–2.4)

## 2017-05-12 MED ORDER — AMOXICILLIN-POT CLAVULANATE 875-125 MG PO TABS
1.0000 | ORAL_TABLET | Freq: Two times a day (BID) | ORAL | Status: DC
  Administered 2017-05-12 – 2017-05-13 (×3): 1 via ORAL
  Filled 2017-05-12 (×4): qty 1

## 2017-05-12 MED ORDER — POTASSIUM CHLORIDE CRYS ER 20 MEQ PO TBCR
60.0000 meq | EXTENDED_RELEASE_TABLET | Freq: Once | ORAL | Status: AC
  Administered 2017-05-12: 60 meq via ORAL
  Filled 2017-05-12: qty 3

## 2017-05-12 NOTE — Progress Notes (Signed)
PROGRESS NOTE    Donald Torres  OBS:[id number] DOB: Jun 24, 1961 DOA: 05/11/2017 PCP: Lajean Manes, MD   Brief Narrative:  56 y.o. WM PMHx CAD with NSTEMI in 2015, DES to LAD at that time, no further stents since that time; HTN; HLD, normal EF. HLD    Presents to the ED at [facility] with c/o R sided throat and neck swelling.  Broke a tooth about 2 weeks ago, doesn't have dental insurance.  Pain constant for last 2 weeks but significant worsening over last 3 days.  R sided throat and jaw swelling worse over last 3 days.  No fevers nor chills.   ED Course: CT reveals infection / abscess coming from tooth in lower mandible.  Patient put on unasyn and given dose of dexamethasone.  Transferred to [facility] after they consulted oral surgery.  Subjective: 7/15 A/O 4, negative CP, negative SOB, negative N/V. States right lower jaw pain has decreased. Unsure of how he fractured tooth.    Assessment & Plan:   Principal Problem:   Ludwig's angina Active Problems:   HTN (hypertension), benign   Coronary arteriosclerosis   Dental infection with neck swelling  -Seen by Dr. Diona Browner, will discharge patient in the a.m. if he tolerates  PO antibiotics and then patient will proceed to dental office for extraction of infected tooth  Right lower crack molar with infection -DC Unasyn and start patient on Augmentin if patient tolerates will discharge. -Patient follow-up with DDS Diona Browner for extraction of tooth #31  HTN -Norvasc 5 mg -HCTZ 25 mg daily -Irbesartan 300 mg daily  Hypokalemia -Potassium goal> 4 -K-Dur 60 mEq K Dur     DVT prophylaxis: Subcutaneous heparin Code Status: Full Family Communication: Wife present at bedside Disposition Plan: Discharge in a.m. Patient to proceed to dental office for tooth extraction   Consultants:  DDS Diona Browner ENT Dr Burley Saver  Procedures/Significant Events:     VENTILATOR  SETTINGS:    Cultures   Antimicrobials: Anti-infectives    Start     Stop   05/12/17 1230  amoxicillin-clavulanate (AUGMENTIN) 875-125 MG per tablet 1 tablet         05/11/17 1400  Ampicillin-Sulbactam (UNASYN) 3 g in sodium chloride 0.9 % 100 mL IVPB  Status:  Discontinued     05/12/17 1227       Devices    LINES / TUBES:      Continuous Infusions: . sodium chloride 75 mL/hr at 05/11/17 2300  . ampicillin-sulbactam (UNASYN) IV Stopped (05/12/17 0227)     Objective: Vitals:   05/11/17 2330 05/12/17 0300 05/12/17 0513 05/12/17 0808  BP: (!) 114/54 (!) 100/56 127/67 (!) 141/71  Pulse: 81 77 79   Resp: 14 14 11    Temp: 98.1 F (36.7 C)  97.9 F (36.6 C) 97.6 F (36.4 C)  TempSrc: Oral  Oral Oral  SpO2: 94% 94% 95%   Weight:      Height:        Intake/Output Summary (Last 24 hours) at 05/12/17 0811 Last data filed at 05/12/17 4765  Gross per 24 hour  Intake              500 ml  Output             1150 ml  Net             -650 ml   Filed Weights   05/11/17 0958 05/11/17 1918  Weight: 218 lb (98.9 kg) 213 lb  1.6 oz (96.7 kg)    Examination:  General: A/O 4, No acute respiratory distress Eyes: negative scleral hemorrhage, negative anisocoria, negative icterus ENT: Negative Runny nose, negative gingival bleeding, right lower first molar cracked, some swelling right mandible negative pain to palpation, positive right submandibular swelling Neck:  Negative scars, masses, torticollis, lymphadenopathy, JVD Lungs: Clear to auscultation bilaterally without wheezes or crackles Cardiovascular: Regular rate and rhythm without murmur gallop or rub normal S1 and S2 Abdomen: negative abdominal pain, nondistended, positive soft, bowel sounds, no rebound, no ascites, no appreciable mass Extremities: No significant cyanosis, clubbing, or edema bilateral lower extremities Skin: Negative rashes, lesions, ulcers Psychiatric:  Negative depression, negative anxiety,  negative fatigue, negative mania  Central nervous system:  Cranial nerves II through XII intact, tongue/uvula midline, all extremities muscle strength 5/5, sensation intact throughout,  negative dysarthria, negative expressive aphasia, negative receptive aphasia.  .     Data Reviewed: Care during the described time interval was provided by me .  I have reviewed this patient's available data, including medical history, events of note, physical examination, and all test results as part of my evaluation. I have personally reviewed and interpreted all radiology studies.  CBC:  Recent Labs Lab 05/11/17 1110  WBC 14.1*  NEUTROABS 11.8*  HGB 15.1  HCT 43.5  MCV 88.1  PLT 244   Basic Metabolic Panel:  Recent Labs Lab 05/11/17 1110  NA 138  K 3.4*  CL 103  CO2 22  GLUCOSE 98  BUN 13  CREATININE 0.92  CALCIUM 9.0   GFR: Estimated Creatinine Clearance: 110.6 mL/min (by C-G formula based on SCr of 0.92 mg/dL). Liver Function Tests: No results for input(s): AST, ALT, ALKPHOS, BILITOT, PROT, ALBUMIN in the last 168 hours. No results for input(s): LIPASE, AMYLASE in the last 168 hours. No results for input(s): AMMONIA in the last 168 hours. Coagulation Profile: No results for input(s): INR, PROTIME in the last 168 hours. Cardiac Enzymes: No results for input(s): CKTOTAL, CKMB, CKMBINDEX, TROPONINI in the last 168 hours. BNP (last 3 results) No results for input(s): PROBNP in the last 8760 hours. HbA1C: No results for input(s): HGBA1C in the last 72 hours. CBG:  Recent Labs Lab 05/11/17 2002 05/11/17 2329 05/12/17 0510 05/12/17 0807  GLUCAP 159* 181* 123* 191*   Lipid Profile: No results for input(s): CHOL, HDL, LDLCALC, TRIG, CHOLHDL, LDLDIRECT in the last 72 hours. Thyroid Function Tests: No results for input(s): TSH, T4TOTAL, FREET4, T3FREE, THYROIDAB in the last 72 hours. Anemia Panel: No results for input(s): VITAMINB12, FOLATE, FERRITIN, TIBC, IRON, RETICCTPCT in  the last 72 hours. Urine analysis: No results found for: COLORURINE, APPEARANCEUR, LABSPEC, PHURINE, GLUCOSEU, HGBUR, BILIRUBINUR, KETONESUR, PROTEINUR, UROBILINOGEN, NITRITE, LEUKOCYTESUR Sepsis Labs: @LABRCNTIP (procalcitonin:4,lacticidven:4)  ) Recent Results (from the past 240 hour(s))  Rapid strep screen     Status: None   Collection Time: 05/11/17 11:25 AM  Result Value Ref Range Status   Streptococcus, Group A Screen (Direct) NEGATIVE NEGATIVE Final    Comment: (NOTE) A Rapid Antigen test may result negative if the antigen level in the sample is below the detection level of this test. The FDA has not cleared this test as a stand-alone test therefore the rapid antigen negative result has reflexed to a Group A Strep culture.          Radiology Studies: Ct Soft Tissue Neck W Contrast  Result Date: 05/11/2017 CLINICAL DATA:  Patient cracked tooth 2 weeks ago. Pain and swelling with induration to the RIGHT  neck. Pain with swallowing. Elevated white count. Negative strep test. EXAM: CT NECK WITH CONTRAST TECHNIQUE: Multidetector CT imaging of the neck was performed using the standard protocol following the bolus administration of intravenous contrast. CONTRAST:  165mL ISOVUE-300 IOPAMIDOL (ISOVUE-300) INJECTION 61% COMPARISON:  None. FINDINGS: Pharynx and larynx: Clinically, ordering provider reports poor dentition. Assessment with CT is difficult due to beam hardening, however RIGHT mandibular periapical lucencies are suggested, along with a possible dental caries of a RIGHT mandibular molar. Edema in the region of the RIGHT base of tongue, with mass effect on the RIGHT hypopharynx, surrounding the RIGHT submandibular gland, with stranding of the fat and RIGHT platysma. Slight effacement of the airway is noted from RIGHT to LEFT, for instance image 48 series 3 There is no discrete abscess. Overall the findings are most consistent with dental related floor of mouth inflammatory phlegmon.  If untreated, further swelling and abscess could develop leading to airway compromise and Ludwig's angina. Salivary glands: No intrinsic inflammation, mass, or stone. Thyroid: There is a large thyroid nodule, partially calcified, measuring 20 x 24 x 30 mm. Further evaluation with sonography and tissue sampling is warranted. Lymph nodes: Reactive lymphadenopathy in level 1 and level 2, greater on the RIGHT Vascular: Patent. Limited intracranial: Negative. Visualized orbits: Negative. Mastoids and visualized paranasal sinuses: Negative. Skeleton: Spondylosis.  No worrisome osseous findings. Upper chest: Negative. Other: None. IMPRESSION: In this patient with poor dentition, the mass effect observed in the RIGHT floor of mouth, involving the submandibular, and parapharyngeal spaces primarily, with edema extending into the base of tongue is most consistent with a dental related inflammatory phlegmon. No abscess is seen. ENT consultation is warranted, to monitor closely for airway compromise and/or development of Ludwig's angina. Findings discussed with ordering provider. 2 x 2.5 x 3 cm RIGHT thyroid nodule.  Tissue sampling is warranted. Electronically Signed   By: Staci Righter M.D.   On: 05/11/2017 12:54        Scheduled Meds: . amLODipine  5 mg Oral Daily  . buPROPion  150 mg Oral Daily  . Chlorhexidine Gluconate Cloth  6 each Topical Q0600  . clopidogrel  75 mg Oral Daily  . ezetimibe  10 mg Oral Daily  . heparin  5,000 Units Subcutaneous Q8H  . hydrochlorothiazide  25 mg Oral Daily  . irbesartan  300 mg Oral Daily  . mupirocin ointment  1 application Nasal BID  . pravastatin  80 mg Oral Daily  . sodium chloride flush  3 mL Intravenous Q12H   Continuous Infusions: . sodium chloride 75 mL/hr at 05/11/17 2300  . ampicillin-sulbactam (UNASYN) IV Stopped (05/12/17 0227)     LOS: 1 day    Time spent: 40 minutes    WOODS, Geraldo Docker, MD [facility] Pager [phone]   If 7PM-7AM,  please contact night-coverage [email or web address] Password [email or web address] 05/12/2017, 8:11 AM

## 2017-05-12 NOTE — Consult Note (Signed)
Reason for Consult: Odontogenic infection, right neck swelling. Referring Physician: Joanne Gavel, PA-C  HPI:  Donald Torres is an 56 y.o. male who initially presented to [facility] yesterday for treatment of his right sided neck and throat swelling. The patient reports that he broke a tooth about 2 weeks ago, but did not have dental insurance so he did not follow-up with a dentist. He reports the pain has been constant for the last 2 weeks, but significantly worsened over the last 3 days. He also reports right-sided throat and jaw swelling that has significantly worsened over the last 3 days. He reports associated right-sided facial pain that radiates up to his right ear. No fever or chills. He reports that he has not been able to eat much over the last 3 days due to the pain. He denies dyspnea, dysphasia, drooling, or trismus. His CT scan showed odontogenic phlegmon. No obvious abscess.   Past Medical History:  Diagnosis Date  . CAD (coronary artery disease)    NSTEMI 06/2014 >>> LHC (9/15):  Mid LAD 90% with mobile thrombus, EF 60% >> PCI:  Aspiration thrombectomy + 2.75 x 28 Promus DES to mid LAD  . High cholesterol   . Hx of echocardiogram    Echo (9/15):  EF 55-60%, no RWMA, Gr 1 DD, mild BAE  . Hypertension     Past Surgical History:  Procedure Laterality Date  . HERNIA REPAIR    . LEFT HEART CATHETERIZATION WITH CORONARY ANGIOGRAM N/A 07/26/2014   Procedure: LEFT HEART CATHETERIZATION WITH CORONARY ANGIOGRAM;  Surgeon: Jettie Booze, MD;  Location: [facility] CATH LAB;  Service: Cardiovascular;  Laterality: N/A;    Family History  Problem Relation Age of Onset  . Heart attack Father   . Hypertension Father   . Heart attack Brother   . Diabetes Brother   . Cancer Mother   . Hypertension Mother   . Heart attack Unknown     Social History:  reports that he quit smoking about 2 years ago. He has a 36.00 pack-year smoking history. He has never used smokeless tobacco.  He reports that he drinks alcohol. He reports that he does not use drugs.  Allergies: No Known Allergies  Prior to Admission medications   Medication Sig Start Date End Date Taking? Authorizing Provider  amLODipine (NORVASC) 5 MG tablet Take 5 mg by mouth daily.   Yes [provider]  buPROPion (WELLBUTRIN XL) 150 MG 24 hr tablet Take 1 tablet by mouth daily. 09/03/16  Yes [provider]  carvedilol (COREG) 6.25 MG tablet Take 6.25 mg by mouth 2 (two) times daily with a meal.   Yes [provider]  clopidogrel (PLAVIX) 75 MG tablet Take 1 tablet (75 mg total) by mouth daily. 09/14/16  Yes Jettie Booze, MD  ezetimibe (ZETIA) 10 MG tablet Take 10 mg by mouth daily.   Yes [provider]  hydrochlorothiazide (HYDRODIURIL) 25 MG tablet Take 1 tablet (25 mg total) by mouth daily. 09/14/16  Yes Jettie Booze, MD  irbesartan (AVAPRO) 300 MG tablet Take 300 mg by mouth daily.   Yes [provider]  Omega-3 Fatty Acids (FISH OIL PO) Take 1,000 mg by mouth daily.    Yes [provider]  nitroGLYCERIN (NITROSTAT) 0.4 MG SL tablet Place 1 tablet (0.4 mg total) under the tongue every 5 (five) minutes x 3 doses as needed. For chest pain 09/14/16   Jettie Booze, MD    Medications:  I have reviewed the patient's current medications. Scheduled: . amLODipine  5 mg Oral Daily  . buPROPion  150 mg Oral Daily  . Chlorhexidine Gluconate Cloth  6 each Topical Q0600  . clopidogrel  75 mg Oral Daily  . ezetimibe  10 mg Oral Daily  . heparin  5,000 Units Subcutaneous Q8H  . hydrochlorothiazide  25 mg Oral Daily  . irbesartan  300 mg Oral Daily  . mupirocin ointment  1 application Nasal BID  . potassium chloride  60 mEq Oral Once  . pravastatin  80 mg Oral Daily  . sodium chloride flush  3 mL Intravenous Q12H   Continuous: . sodium chloride 75 mL/hr at 05/11/17 2300  . ampicillin-sulbactam (UNASYN) IV Stopped (05/12/17 9292)    KMQ:KMMNOTRRNHAFB **OR** acetaminophen, morphine injection, ondansetron **OR** ondansetron (ZOFRAN) IV, polyethylene glycol  Results for orders placed or performed during the hospital encounter of 05/11/17 (from the past 48 hour(s))  CBC with Differential     Status: Abnormal   Collection Time: 05/11/17 11:10 AM  Result Value Ref Range   WBC 14.1 (H) 4.0 - 10.5 K/uL   RBC 4.94 4.22 - 5.81 MIL/uL   Hemoglobin 15.1 13.0 - 17.0 g/dL   HCT 43.5 39.0 - 52.0 %   MCV 88.1 78.0 - 100.0 fL   MCH 30.6 26.0 - 34.0 pg   MCHC 34.7 30.0 - 36.0 g/dL   RDW 14.9 11.5 - 15.5 %   Platelets 194 150 - 400 K/uL   Neutrophils Relative % 85 %   Neutro Abs 11.8 (H) 1.7 - 7.7 K/uL   Lymphocytes Relative 7 %   Lymphs Abs 1.0 0.7 - 4.0 K/uL   Monocytes Relative 8 %   Monocytes Absolute 1.2 (H) 0.1 - 1.0 K/uL   Eosinophils Relative 0 %   Eosinophils Absolute 0.0 0.0 - 0.7 K/uL   Basophils Relative 0 %   Basophils Absolute 0.0 0.0 - 0.1 K/uL  Basic metabolic panel     Status: Abnormal   Collection Time: 05/11/17 11:10 AM  Result Value Ref Range   Sodium 138 135 - 145 mmol/L   Potassium 3.4 (L) 3.5 - 5.1 mmol/L   Chloride 103 101 - 111 mmol/L   CO2 22 22 - 32 mmol/L   Glucose, Bld 98 65 - 99 mg/dL   BUN 13 6 - 20 mg/dL   Creatinine, Ser 0.92 0.61 - 1.24 mg/dL   Calcium 9.0 8.9 - 10.3 mg/dL   GFR calc non Af Amer >60 >60 mL/min   GFR calc Af Amer >60 >60 mL/min    Comment: (NOTE) The eGFR has been calculated using the CKD EPI equation. This calculation has not been validated in all clinical situations. eGFR's persistently <60 mL/min signify possible Chronic Kidney Disease.    Anion gap 13 5 - 15  I-Stat CG4 Lactic Acid, ED     Status: Abnormal   Collection Time: 05/11/17 11:24 AM  Result Value Ref Range   Lactic Acid, Venous 2.05 (HH) 0.5 - 1.9 mmol/L   Comment NOTIFIED PHYSICIAN   Rapid strep screen     Status: None   Collection Time: 05/11/17 11:25 AM  Result Value Ref Range    Streptococcus, Group A Screen (Direct) NEGATIVE NEGATIVE    Comment: (NOTE) A Rapid Antigen test may result negative if the antigen level in the sample is below the detection level of this test. The FDA has not cleared this test as a stand-alone test therefore the rapid antigen  negative result has reflexed to a Group A Strep culture.   I-Stat CG4 Lactic Acid, ED     Status: None   Collection Time: 05/11/17  2:00 PM  Result Value Ref Range   Lactic Acid, Venous 0.95 0.5 - 1.9 mmol/L  Glucose, capillary     Status: Abnormal   Collection Time: 05/11/17  8:02 PM  Result Value Ref Range   Glucose-Capillary 159 (H) 65 - 99 mg/dL  Glucose, capillary     Status: Abnormal   Collection Time: 05/11/17 11:29 PM  Result Value Ref Range   Glucose-Capillary 181 (H) 65 - 99 mg/dL  Glucose, capillary     Status: Abnormal   Collection Time: 05/12/17  5:10 AM  Result Value Ref Range   Glucose-Capillary 123 (H) 65 - 99 mg/dL  Glucose, capillary     Status: Abnormal   Collection Time: 05/12/17  8:07 AM  Result Value Ref Range   Glucose-Capillary 191 (H) 65 - 99 mg/dL   Comment 1 Notify RN    Comment 2 Document in Chart   Basic metabolic panel     Status: Abnormal   Collection Time: 05/12/17 10:02 AM  Result Value Ref Range   Sodium 137 135 - 145 mmol/L   Potassium 3.3 (L) 3.5 - 5.1 mmol/L   Chloride 104 101 - 111 mmol/L   CO2 25 22 - 32 mmol/L   Glucose, Bld 159 (H) 65 - 99 mg/dL   BUN 19 6 - 20 mg/dL   Creatinine, Ser 0.98 0.61 - 1.24 mg/dL   Calcium 8.8 (L) 8.9 - 10.3 mg/dL   GFR calc non Af Amer >60 >60 mL/min   GFR calc Af Amer >60 >60 mL/min    Comment: (NOTE) The eGFR has been calculated using the CKD EPI equation. This calculation has not been validated in all clinical situations. eGFR's persistently <60 mL/min signify possible Chronic Kidney Disease.    Anion gap 8 5 - 15  Magnesium     Status: None   Collection Time: 05/12/17 10:02 AM  Result Value Ref Range   Magnesium 2.2  1.7 - 2.4 mg/dL  CBC with Differential/Platelet     Status: Abnormal   Collection Time: 05/12/17 10:02 AM  Result Value Ref Range   WBC 18.3 (H) 4.0 - 10.5 K/uL   RBC 4.54 4.22 - 5.81 MIL/uL   Hemoglobin 13.5 13.0 - 17.0 g/dL   HCT 40.8 39.0 - 52.0 %   MCV 89.9 78.0 - 100.0 fL   MCH 29.7 26.0 - 34.0 pg   MCHC 33.1 30.0 - 36.0 g/dL   RDW 14.9 11.5 - 15.5 %   Platelets 240 150 - 400 K/uL   Neutrophils Relative % 89 %   Neutro Abs 16.3 (H) 1.7 - 7.7 K/uL   Lymphocytes Relative 6 %   Lymphs Abs 1.0 0.7 - 4.0 K/uL   Monocytes Relative 5 %   Monocytes Absolute 0.9 0.1 - 1.0 K/uL   Eosinophils Relative 0 %   Eosinophils Absolute 0.0 0.0 - 0.7 K/uL   Basophils Relative 0 %   Basophils Absolute 0.0 0.0 - 0.1 K/uL  Protime-INR     Status: None   Collection Time: 05/12/17 10:02 AM  Result Value Ref Range   Prothrombin Time 13.9 11.4 - 15.2 seconds   INR 1.07     Ct Soft Tissue Neck W Contrast  Result Date: 05/11/2017 CLINICAL DATA:  Patient cracked tooth 2 weeks ago. Pain and swelling with induration to  the RIGHT neck. Pain with swallowing. Elevated white count. Negative strep test. EXAM: CT NECK WITH CONTRAST TECHNIQUE: Multidetector CT imaging of the neck was performed using the standard protocol following the bolus administration of intravenous contrast. CONTRAST:  135m ISOVUE-300 IOPAMIDOL (ISOVUE-300) INJECTION 61% COMPARISON:  None. FINDINGS: Pharynx and larynx: Clinically, ordering provider reports poor dentition. Assessment with CT is difficult due to beam hardening, however RIGHT mandibular periapical lucencies are suggested, along with a possible dental caries of a RIGHT mandibular molar. Edema in the region of the RIGHT base of tongue, with mass effect on the RIGHT hypopharynx, surrounding the RIGHT submandibular gland, with stranding of the fat and RIGHT platysma. Slight effacement of the airway is noted from RIGHT to LEFT, for instance image 48 series 3 There is no discrete  abscess. Overall the findings are most consistent with dental related floor of mouth inflammatory phlegmon. If untreated, further swelling and abscess could develop leading to airway compromise and Ludwig's angina. Salivary glands: No intrinsic inflammation, mass, or stone. Thyroid: There is a large thyroid nodule, partially calcified, measuring 20 x 24 x 30 mm. Further evaluation with sonography and tissue sampling is warranted. Lymph nodes: Reactive lymphadenopathy in level 1 and level 2, greater on the RIGHT Vascular: Patent. Limited intracranial: Negative. Visualized orbits: Negative. Mastoids and visualized paranasal sinuses: Negative. Skeleton: Spondylosis.  No worrisome osseous findings. Upper chest: Negative. Other: None. IMPRESSION: In this patient with poor dentition, the mass effect observed in the RIGHT floor of mouth, involving the submandibular, and parapharyngeal spaces primarily, with edema extending into the base of tongue is most consistent with a dental related inflammatory phlegmon. No abscess is seen. ENT consultation is warranted, to monitor closely for airway compromise and/or development of Ludwig's angina. Findings discussed with ordering provider. 2 x 2.5 x 3 cm RIGHT thyroid nodule.  Tissue sampling is warranted. Electronically Signed   By: JStaci RighterM.D.   On: 05/11/2017 12:54   Review of Systems  Constitutional: Negative for chills and fever.  HENT: Positive for dental problem, facial swelling and sore throat. Negative for drooling, ear pain, trouble swallowing and voice change.    Blood pressure 130/70, pulse 79, temperature 97.7 F (36.5 C), temperature source Oral, resp. rate 11, height 6' (1.829 m), weight 213 lb 1.6 oz (96.7 kg), SpO2 95 %. Physical Exam  Constitutional: He appears well-developed.  A&Ox3. Head: Normocephalic.  Mouth: Poor dentition with periodontal disease. Moderate swelling and induration over the right anterior neck. No left-sided swelling. Uvula is  midline. Gingiva are nontender. No tenderness to palpation over the teeth.  Eyes: PERRL, EOMI. Conjunctivae are normal.  Ears: Normal auricles. Neck: Neck supple. No stridor. Not tender to touch. Cardiovascular: Normal rate, regular rhythm, normal heart sounds and intact distal pulses.   Pulmonary/Chest: Effort normal and breath sounds normal. No respiratory distress. He has no wheezes. He has no rales.  Neurological: He is alert & oriented. Skin: Skin is warm and dry.  Psychiatric: His behavior is normal.  Nursing note and vitals reviewed.  Assessment/Plan: Odontogenic infection with no drainable abscess. Clinically improved with IV abx. Pt will f/u with Dr. JHoyt Kochfor dental extraction tomorrow. Ok to d/c home with oral abx.  Lukasz Rogus W Granvel [place] 05/12/2017, 12:21 PM

## 2017-05-12 NOTE — Consult Note (Signed)
Reason for Consult: right neck swelling Referring Physician: ER  Donald Torres is an 56 y.o. male.  EX:BMWU, swelling under right jaw   HPI: Patient broke tooth lower right approximately 2 weeks ago. Began having significant pain 3 days ago. Presented to ER yesterday and started IV antibiotics. Feeling much better today.  Past Medical History:  Diagnosis Date  . CAD (coronary artery disease)    NSTEMI 06/2014 >>> LHC (9/15):  Mid LAD 90% with mobile thrombus, EF 60% >> PCI:  Aspiration thrombectomy + 2.75 x 28 Promus DES to mid LAD  . High cholesterol   . Hx of echocardiogram    Echo (9/15):  EF 55-60%, no RWMA, Gr 1 DD, mild BAE  . Hypertension     Past Surgical History:  Procedure Laterality Date  . HERNIA REPAIR    . LEFT HEART CATHETERIZATION WITH CORONARY ANGIOGRAM N/A 07/26/2014   Procedure: LEFT HEART CATHETERIZATION WITH CORONARY ANGIOGRAM;  Surgeon: Jettie Booze, MD;  Location: [facility] CATH LAB;  Service: Cardiovascular;  Laterality: N/A;    Family History  Problem Relation Age of Onset  . Heart attack Father   . Hypertension Father   . Heart attack Brother   . Diabetes Brother   . Cancer Mother   . Hypertension Mother   . Heart attack Unknown     Social History:  reports that he quit smoking about 2 years ago. He has a 36.00 pack-year smoking history. He has never used smokeless tobacco. He reports that he drinks alcohol. He reports that he does not use drugs.  Allergies: No Known Allergies  Medications: I have reviewed the patient's current medications.  Results for orders placed or performed during the hospital encounter of 05/11/17 (from the past 48 hour(s))  CBC with Differential     Status: Abnormal   Collection Time: 05/11/17 11:10 AM  Result Value Ref Range   WBC 14.1 (H) 4.0 - 10.5 K/uL   RBC 4.94 4.22 - 5.81 MIL/uL   Hemoglobin 15.1 13.0 - 17.0 g/dL   HCT 43.5 39.0 - 52.0 %   MCV 88.1 78.0 - 100.0 fL   MCH 30.6 26.0 - 34.0 pg   MCHC 34.7 30.0  - 36.0 g/dL   RDW 14.9 11.5 - 15.5 %   Platelets 194 150 - 400 K/uL   Neutrophils Relative % 85 %   Neutro Abs 11.8 (H) 1.7 - 7.7 K/uL   Lymphocytes Relative 7 %   Lymphs Abs 1.0 0.7 - 4.0 K/uL   Monocytes Relative 8 %   Monocytes Absolute 1.2 (H) 0.1 - 1.0 K/uL   Eosinophils Relative 0 %   Eosinophils Absolute 0.0 0.0 - 0.7 K/uL   Basophils Relative 0 %   Basophils Absolute 0.0 0.0 - 0.1 K/uL  Basic metabolic panel     Status: Abnormal   Collection Time: 05/11/17 11:10 AM  Result Value Ref Range   Sodium 138 135 - 145 mmol/L   Potassium 3.4 (L) 3.5 - 5.1 mmol/L   Chloride 103 101 - 111 mmol/L   CO2 22 22 - 32 mmol/L   Glucose, Bld 98 65 - 99 mg/dL   BUN 13 6 - 20 mg/dL   Creatinine, Ser 0.92 0.61 - 1.24 mg/dL   Calcium 9.0 8.9 - 10.3 mg/dL   GFR calc non Af Amer >60 >60 mL/min   GFR calc Af Amer >60 >60 mL/min    Comment: (NOTE) The eGFR has been calculated using the CKD EPI equation.  This calculation has not been validated in all clinical situations. eGFR's persistently <60 mL/min signify possible Chronic Kidney Disease.    Anion gap 13 5 - 15  I-Stat CG4 Lactic Acid, ED     Status: Abnormal   Collection Time: 05/11/17 11:24 AM  Result Value Ref Range   Lactic Acid, Venous 2.05 (HH) 0.5 - 1.9 mmol/L   Comment NOTIFIED PHYSICIAN   Rapid strep screen     Status: None   Collection Time: 05/11/17 11:25 AM  Result Value Ref Range   Streptococcus, Group A Screen (Direct) NEGATIVE NEGATIVE    Comment: (NOTE) A Rapid Antigen test may result negative if the antigen level in the sample is below the detection level of this test. The FDA has not cleared this test as a stand-alone test therefore the rapid antigen negative result has reflexed to a Group A Strep culture.   I-Stat CG4 Lactic Acid, ED     Status: None   Collection Time: 05/11/17  2:00 PM  Result Value Ref Range   Lactic Acid, Venous 0.95 0.5 - 1.9 mmol/L  Glucose, capillary     Status: Abnormal   Collection  Time: 05/11/17  8:02 PM  Result Value Ref Range   Glucose-Capillary 159 (H) 65 - 99 mg/dL  Glucose, capillary     Status: Abnormal   Collection Time: 05/11/17 11:29 PM  Result Value Ref Range   Glucose-Capillary 181 (H) 65 - 99 mg/dL  Glucose, capillary     Status: Abnormal   Collection Time: 05/12/17  5:10 AM  Result Value Ref Range   Glucose-Capillary 123 (H) 65 - 99 mg/dL  Glucose, capillary     Status: Abnormal   Collection Time: 05/12/17  8:07 AM  Result Value Ref Range   Glucose-Capillary 191 (H) 65 - 99 mg/dL   Comment 1 Notify RN    Comment 2 Document in Chart     Ct Soft Tissue Neck W Contrast  Result Date: 05/11/2017 CLINICAL DATA:  Patient cracked tooth 2 weeks ago. Pain and swelling with induration to the RIGHT neck. Pain with swallowing. Elevated white count. Negative strep test. EXAM: CT NECK WITH CONTRAST TECHNIQUE: Multidetector CT imaging of the neck was performed using the standard protocol following the bolus administration of intravenous contrast. CONTRAST:  136m ISOVUE-300 IOPAMIDOL (ISOVUE-300) INJECTION 61% COMPARISON:  None. FINDINGS: Pharynx and larynx: Clinically, ordering provider reports poor dentition. Assessment with CT is difficult due to beam hardening, however RIGHT mandibular periapical lucencies are suggested, along with a possible dental caries of a RIGHT mandibular molar. Edema in the region of the RIGHT base of tongue, with mass effect on the RIGHT hypopharynx, surrounding the RIGHT submandibular gland, with stranding of the fat and RIGHT platysma. Slight effacement of the airway is noted from RIGHT to LEFT, for instance image 48 series 3 There is no discrete abscess. Overall the findings are most consistent with dental related floor of mouth inflammatory phlegmon. If untreated, further swelling and abscess could develop leading to airway compromise and Ludwig's angina. Salivary glands: No intrinsic inflammation, mass, or stone. Thyroid: There is a large  thyroid nodule, partially calcified, measuring 20 x 24 x 30 mm. Further evaluation with sonography and tissue sampling is warranted. Lymph nodes: Reactive lymphadenopathy in level 1 and level 2, greater on the RIGHT Vascular: Patent. Limited intracranial: Negative. Visualized orbits: Negative. Mastoids and visualized paranasal sinuses: Negative. Skeleton: Spondylosis.  No worrisome osseous findings. Upper chest: Negative. Other: None. IMPRESSION: In this patient with  poor dentition, the mass effect observed in the RIGHT floor of mouth, involving the submandibular, and parapharyngeal spaces primarily, with edema extending into the base of tongue is most consistent with a dental related inflammatory phlegmon. No abscess is seen. ENT consultation is warranted, to monitor closely for airway compromise and/or development of Ludwig's angina. Findings discussed with ordering provider. 2 x 2.5 x 3 cm RIGHT thyroid nodule.  Tissue sampling is warranted. Electronically Signed   By: Staci Righter M.D.   On: 05/11/2017 12:54    ROS  HEENT: Denies dysphagia, dysphonia, difficulty breathing.  Blood pressure (!) 141/71, pulse 79, temperature 97.6 F (36.4 C), temperature source Oral, resp. rate 11, height 6' (1.829 m), weight 213 lb 1.6 oz (96.7 kg), SpO2 95 %. General appearance: alert, cooperative and no distress Head: Normocephalic, without obvious abnormality, atraumatic Eyes: negative Nose: Nares normal. Septum midline. Mucosa normal. No drainage or sinus tenderness. Throat: lips, mucosa, and tongue normal; teeth and gums normal and Decayed tooth #31. No FOM or buccal vestibule swelling, edema. No trismus. No peritonsillar edema. uvula midline.  Neck: supple, symmetrical, trachea midline and mild right submandibular edema  Assessment/Plan: Right submandibular space infection secondary to carious tooth # 31. No airway issues. Patient may benefit from more IV abx but appears stable for d/c on PO antibiotics. Will  extract tooth # 31 tomorrow in office pending pt discharge.  Jeffrie Stander M 05/12/2017, 10:02 AM

## 2017-05-12 NOTE — Progress Notes (Signed)
  Echocardiogram 2D Echocardiogram has been performed.  Donald Torres Donald Torres 05/12/2017, 5:57 PM

## 2017-05-13 DIAGNOSIS — I10 Essential (primary) hypertension: Secondary | ICD-10-CM

## 2017-05-13 DIAGNOSIS — R221 Localized swelling, mass and lump, neck: Secondary | ICD-10-CM

## 2017-05-13 DIAGNOSIS — E876 Hypokalemia: Secondary | ICD-10-CM

## 2017-05-13 DIAGNOSIS — K047 Periapical abscess without sinus: Secondary | ICD-10-CM

## 2017-05-13 DIAGNOSIS — S025XXA Fracture of tooth (traumatic), initial encounter for closed fracture: Secondary | ICD-10-CM

## 2017-05-13 DIAGNOSIS — K122 Cellulitis and abscess of mouth: Secondary | ICD-10-CM

## 2017-05-13 LAB — ECHOCARDIOGRAM COMPLETE
AVLVOTPG: 7 mmHg
CHL CUP DOP CALC LVOT VTI: 28 cm
CHL CUP MV DEC (S): 176
EERAT: 7.5
EWDT: 176 ms
FS: 40 % (ref 28–44)
Height: 72 in
IV/PV OW: 1.02
LA diam end sys: 29 mm
LA vol A4C: 57.2 ml
LADIAMINDEX: 1.3 cm/m2
LASIZE: 29 mm
LV E/e' medial: 7.5
LV e' LATERAL: 10.9 cm/s
LVEEAVG: 7.5
LVOT SV: 71 mL
LVOT area: 2.54 cm2
LVOT diameter: 18 mm
LVOT peak vel: 135 cm/s
Lateral S' vel: 12.8 cm/s
MV pk E vel: 81.7 m/s
MVAP: 4.23 cm2
MVPG: 3 mmHg
MVPKAVEL: 77.1 m/s
MVSPHT: 52 ms
PW: 12.8 mm — AB (ref 0.6–1.1)
RV TAPSE: 28.5 mm
TDI e' lateral: 10.9
TDI e' medial: 10
Weight: 3409.6 oz

## 2017-05-13 LAB — CBC
HCT: 39.1 % (ref 39.0–52.0)
HEMOGLOBIN: 12.8 g/dL — AB (ref 13.0–17.0)
MCH: 29.6 pg (ref 26.0–34.0)
MCHC: 32.7 g/dL (ref 30.0–36.0)
MCV: 90.5 fL (ref 78.0–100.0)
Platelets: 230 10*3/uL (ref 150–400)
RBC: 4.32 MIL/uL (ref 4.22–5.81)
RDW: 15 % (ref 11.5–15.5)
WBC: 14.8 10*3/uL — AB (ref 4.0–10.5)

## 2017-05-13 LAB — HIV ANTIBODY (ROUTINE TESTING W REFLEX): HIV Screen 4th Generation wRfx: NONREACTIVE

## 2017-05-13 LAB — CULTURE, GROUP A STREP (THRC)

## 2017-05-13 LAB — MAGNESIUM: MAGNESIUM: 2.2 mg/dL (ref 1.7–2.4)

## 2017-05-13 LAB — LACTIC ACID, PLASMA: LACTIC ACID, VENOUS: 1.2 mmol/L (ref 0.5–1.9)

## 2017-05-13 MED ORDER — AMOXICILLIN-POT CLAVULANATE 875-125 MG PO TABS: 1.0000 | ORAL_TABLET | Freq: Two times a day (BID) | ORAL | 0 refills | Status: DC

## 2017-05-13 MED ORDER — CARVEDILOL 3.125 MG PO TABS: 3.1250 mg | ORAL_TABLET | Freq: Two times a day (BID) | ORAL | 0 refills | Status: DC

## 2017-05-13 MED ORDER — PRAVASTATIN SODIUM 80 MG PO TABS: 80.0000 mg | ORAL_TABLET | Freq: Every day | ORAL | 0 refills | Status: DC

## 2017-05-13 NOTE — Progress Notes (Signed)
Nutrition Brief Note  Patient identified on the Malnutrition Screening Tool (MST) Report  Wt Readings from Last 15 Encounters:  05/11/17 213 lb 1.6 oz (96.7 kg)  09/14/16 225 lb (102.1 kg)  02/02/15 200 lb (90.7 kg)  09/10/14 198 lb 12.8 oz (90.2 kg)  08/11/14 195 lb (88.5 kg)  07/27/14 191 lb 9.3 oz (86.9 kg)    Body mass index is 28.9 kg/m. Patient meets criteria for overweight based on current BMI. Skin WDL. Pt admitted 05/11/17 for neck swelling and neck pain. Discharge order and summary in place with plan for pt to d/c as long as he tolerates PO antibiotics. Per d/c summary, patient will proceed to dental office for extraction of infected tooth.   Current diet order is Heart Healthy and pt consumed 100% of all meals 7/15 which equated to ~1760 kcal and 74 grams of protein. Labs and medications reviewed. No nutrition interventions warranted at this time. If nutrition issues arise, please consult RD.     Jarome Matin, MS, RD, LDN, [facility] Inpatient Clinical Dietitian Pager # [phone] After hours/weekend pager # [phone]

## 2017-05-13 NOTE — Care Management Note (Signed)
Case Management Note  Patient Details  Name: Donald Torres MRN: [id number] Date of Birth: 1961-10-16  Subjective/Objective:   Pt admitted with tooth abscess - consulted with oral surgery and placed on IV antbibiotics                 Action/Plan:   PTA independent from home - lives with adult son.  Pt confirmed he has active insurance and PCP.  NO CM needs determined prior to discharge   Expected Discharge Date:  05/13/17               Expected Discharge Plan:  Home/Self Care  In-House Referral:     Discharge planning Services  CM Consult  Post Acute Care Choice:    Choice offered to:     DME Arranged:    DME Agency:     HH Arranged:    [place] Agency:     Status of Service:  Completed, signed off  If discussed at [employer] of Stay Meetings, dates discussed:    Additional Comments:  Maryclare Labrador, RN 05/13/2017, 8:17 AM

## 2017-05-13 NOTE — Progress Notes (Signed)
Patient discharge teaching complete. Meds, diet, follow up appointments reviewed and all questions answered. Prescriptions and copy of instructions handed to patient.

## 2017-05-13 NOTE — Progress Notes (Signed)
PROGRESS NOTE    Donald Torres  DUK:[id number] DOB: 1961-10-20 DOA: 05/11/2017 PCP: Lajean Manes, MD   Brief Narrative:  56 y.o. WM PMHx CAD with NSTEMI in 2015, DES to LAD at that time, no further stents since that time; HTN; HLD, normal EF. HLD    Presents to the ED at [facility] with c/o R sided throat and neck swelling.  Broke a tooth about 2 weeks ago, doesn't have dental insurance.  Pain constant for last 2 weeks but significant worsening over last 3 days.  R sided throat and jaw swelling worse over last 3 days.  No fevers nor chills.   ED Course: CT reveals infection / abscess coming from tooth in lower mandible.  Patient put on unasyn and given dose of dexamethasone.  Transferred to [facility] after they consulted oral surgery.  Subjective: 7/15 A/O 4, negative CP, negative SOB, negative N/V. States right lower jaw pain has decreased. Unsure of how he fractured tooth.    Assessment & Plan:   Principal Problem:   Ludwig's angina Active Problems:   HTN (hypertension), benign   Coronary arteriosclerosis   Dental infection with neck swelling  -Seen by Dr. Diona Browner, will discharge patient in the a.m. if he tolerates  PO antibiotics and then patient will proceed to dental office for extraction of infected tooth  Right lower crack molar with infection -DC Unasyn and start patient on Augmentin if patient tolerates will discharge. -Patient follow-up with DDS Diona Browner for extraction of tooth #31  HTN -Norvasc 5 mg -HCTZ 25 mg daily -Irbesartan 300 mg daily  Hypokalemia -Potassium goal> 4 -K-Dur 60 mEq K Dur     DVT prophylaxis: Subcutaneous heparin Code Status: Full Family Communication: Wife present at bedside Disposition Plan: Discharge in a.m. Patient to proceed to dental office for tooth extraction   Consultants:  DDS Diona Browner ENT Dr Burley Saver  Procedures/Significant Events:     VENTILATOR  SETTINGS:    Cultures   Antimicrobials: Anti-infectives    Start     Stop   05/12/17 1230  amoxicillin-clavulanate (AUGMENTIN) 875-125 MG per tablet 1 tablet         05/11/17 1400  Ampicillin-Sulbactam (UNASYN) 3 g in sodium chloride 0.9 % 100 mL IVPB  Status:  Discontinued     05/12/17 1227       Devices    LINES / TUBES:      Continuous Infusions: . sodium chloride Stopped (05/12/17 1400)     Objective: Vitals:   05/13/17 0000 05/13/17 0321 05/13/17 0400 05/13/17 0729  BP: (!) 104/53  (!) 144/90 (!) 157/90  Pulse: (!) 54 80 62 85  Resp: 13 11 14 10   Temp:  97.6 F (36.4 C)  98 F (36.7 C)  TempSrc:  Oral  Oral  SpO2: 97% 97% 98% 94%  Weight:      Height:        Intake/Output Summary (Last 24 hours) at 05/13/17 0736 Last data filed at 05/12/17 1800  Gross per 24 hour  Intake             2135 ml  Output                0 ml  Net             2135 ml   Filed Weights   05/11/17 0958 05/11/17 1918  Weight: 218 lb (98.9 kg) 213 lb 1.6 oz (96.7 kg)    Examination:  General:  A/O 4, No acute respiratory distress Eyes: negative scleral hemorrhage, negative anisocoria, negative icterus ENT: Negative Runny nose, negative gingival bleeding, right lower first molar cracked, some swelling right mandible negative pain to palpation, positive right submandibular swelling Neck:  Negative scars, masses, torticollis, lymphadenopathy, JVD Lungs: Clear to auscultation bilaterally without wheezes or crackles Cardiovascular: Regular rate and rhythm without murmur gallop or rub normal S1 and S2 Abdomen: negative abdominal pain, nondistended, positive soft, bowel sounds, no rebound, no ascites, no appreciable mass Extremities: No significant cyanosis, clubbing, or edema bilateral lower extremities Skin: Negative rashes, lesions, ulcers Psychiatric:  Negative depression, negative anxiety, negative fatigue, negative mania  Central nervous system:  Cranial nerves II through  XII intact, tongue/uvula midline, all extremities muscle strength 5/5, sensation intact throughout,  negative dysarthria, negative expressive aphasia, negative receptive aphasia.  .     Data Reviewed: Care during the described time interval was provided by me .  I have reviewed this patient's available data, including medical history, events of note, physical examination, and all test results as part of my evaluation. I have personally reviewed and interpreted all radiology studies.  CBC:  Recent Labs Lab 05/11/17 1110 05/12/17 1002 05/13/17 0134  WBC 14.1* 18.3* 14.8*  NEUTROABS 11.8* 16.3*  --   HGB 15.1 13.5 12.8*  HCT 43.5 40.8 39.1  MCV 88.1 89.9 90.5  PLT 194 240 401   Basic Metabolic Panel:  Recent Labs Lab 05/11/17 1110 05/12/17 1002 05/12/17 1327 05/13/17 0134  NA 138 137 138  --   K 3.4* 3.3* 3.8  --   CL 103 104 103  --   CO2 22 25 26   --   GLUCOSE 98 159* 90  --   BUN 13 19 19   --   CREATININE 0.92 0.98 0.94  --   CALCIUM 9.0 8.8* 9.0  --   MG  --  2.2  --  2.2   GFR: Estimated Creatinine Clearance: 108.3 mL/min (by C-G formula based on SCr of 0.94 mg/dL). Liver Function Tests: No results for input(s): AST, ALT, ALKPHOS, BILITOT, PROT, ALBUMIN in the last 168 hours. No results for input(s): LIPASE, AMYLASE in the last 168 hours. No results for input(s): AMMONIA in the last 168 hours. Coagulation Profile:  Recent Labs Lab 05/12/17 1002  INR 1.07   Cardiac Enzymes: No results for input(s): CKTOTAL, CKMB, CKMBINDEX, TROPONINI in the last 168 hours. BNP (last 3 results) No results for input(s): PROBNP in the last 8760 hours. HbA1C: No results for input(s): HGBA1C in the last 72 hours. CBG:  Recent Labs Lab 05/11/17 2329 05/12/17 0510 05/12/17 0807 05/12/17 1234 05/12/17 1628  GLUCAP 181* 123* 191* 90 160*   Lipid Profile: No results for input(s): CHOL, HDL, LDLCALC, TRIG, CHOLHDL, LDLDIRECT in the last 72 hours. Thyroid Function  Tests: No results for input(s): TSH, T4TOTAL, FREET4, T3FREE, THYROIDAB in the last 72 hours. Anemia Panel: No results for input(s): VITAMINB12, FOLATE, FERRITIN, TIBC, IRON, RETICCTPCT in the last 72 hours. Urine analysis: No results found for: COLORURINE, APPEARANCEUR, LABSPEC, PHURINE, GLUCOSEU, HGBUR, BILIRUBINUR, KETONESUR, PROTEINUR, UROBILINOGEN, NITRITE, LEUKOCYTESUR Sepsis Labs: @LABRCNTIP (procalcitonin:4,lacticidven:4)  ) Recent Results (from the past 240 hour(s))  Rapid strep screen     Status: None   Collection Time: 05/11/17 11:25 AM  Result Value Ref Range Status   Streptococcus, Group A Screen (Direct) NEGATIVE NEGATIVE Final    Comment: (NOTE) A Rapid Antigen test may result negative if the antigen level in the sample is below the detection  level of this test. The FDA has not cleared this test as a stand-alone test therefore the rapid antigen negative result has reflexed to a Group A Strep culture.   Culture, group A strep     Status: None (Preliminary result)   Collection Time: 05/11/17 11:25 AM  Result Value Ref Range Status   Specimen Description THROAT  Final   Special Requests NONE Reflexed from [id number]  Final   Culture   Final    CULTURE REINCUBATED FOR BETTER GROWTH Performed at [facility], [place] [address] [place], [place] [address]    Report Status PENDING  Incomplete         Radiology Studies: Ct Soft Tissue Neck W Contrast  Result Date: 05/11/2017 CLINICAL DATA:  Patient cracked tooth 2 weeks ago. Pain and swelling with induration to the RIGHT neck. Pain with swallowing. Elevated white count. Negative strep test. EXAM: CT NECK WITH CONTRAST TECHNIQUE: Multidetector CT imaging of the neck was performed using the standard protocol following the bolus administration of intravenous contrast. CONTRAST:  176mL ISOVUE-300 IOPAMIDOL (ISOVUE-300) INJECTION 61% COMPARISON:  None. FINDINGS: Pharynx and larynx: Clinically, ordering provider reports poor  dentition. Assessment with CT is difficult due to beam hardening, however RIGHT mandibular periapical lucencies are suggested, along with a possible dental caries of a RIGHT mandibular molar. Edema in the region of the RIGHT base of tongue, with mass effect on the RIGHT hypopharynx, surrounding the RIGHT submandibular gland, with stranding of the fat and RIGHT platysma. Slight effacement of the airway is noted from RIGHT to LEFT, for instance image 48 series 3 There is no discrete abscess. Overall the findings are most consistent with dental related floor of mouth inflammatory phlegmon. If untreated, further swelling and abscess could develop leading to airway compromise and Ludwig's angina. Salivary glands: No intrinsic inflammation, mass, or stone. Thyroid: There is a large thyroid nodule, partially calcified, measuring 20 x 24 x 30 mm. Further evaluation with sonography and tissue sampling is warranted. Lymph nodes: Reactive lymphadenopathy in level 1 and level 2, greater on the RIGHT Vascular: Patent. Limited intracranial: Negative. Visualized orbits: Negative. Mastoids and visualized paranasal sinuses: Negative. Skeleton: Spondylosis.  No worrisome osseous findings. Upper chest: Negative. Other: None. IMPRESSION: In this patient with poor dentition, the mass effect observed in the RIGHT floor of mouth, involving the submandibular, and parapharyngeal spaces primarily, with edema extending into the base of tongue is most consistent with a dental related inflammatory phlegmon. No abscess is seen. ENT consultation is warranted, to monitor closely for airway compromise and/or development of Ludwig's angina. Findings discussed with ordering provider. 2 x 2.5 x 3 cm RIGHT thyroid nodule.  Tissue sampling is warranted. Electronically Signed   By: Staci Righter M.D.   On: 05/11/2017 12:54        Scheduled Meds: . amLODipine  5 mg Oral Daily  . amoxicillin-clavulanate  1 tablet Oral Q12H  . buPROPion  150 mg  Oral Daily  . clopidogrel  75 mg Oral Daily  . ezetimibe  10 mg Oral Daily  . heparin  5,000 Units Subcutaneous Q8H  . hydrochlorothiazide  25 mg Oral Daily  . irbesartan  300 mg Oral Daily  . pravastatin  80 mg Oral Daily  . sodium chloride flush  3 mL Intravenous Q12H   Continuous Infusions: . sodium chloride Stopped (05/12/17 1400)     LOS: 2 days    Time spent: 40 minutes    WOODS, Geraldo Docker, MD [facility]  Pager [phone]   If 7PM-7AM, please contact night-coverage [email or web address] Password [facility] 05/13/2017, 7:36 AM

## 2017-05-13 NOTE — Discharge Summary (Addendum)
Physician Discharge Summary  ISAIHA ASARE XBD:[id number] DOB: 07-Jul-1961 DOA: 05/11/2017  PCP: Lajean Manes, MD  Admit date: 05/11/2017 Discharge date: 05/13/2017  Time spent: 35 minutes  Recommendations for Outpatient Follow-up:  Dental infection with neck swelling  -Seen by Dr. Diona Browner, will discharge patient in the a.m. if he tolerates  PO antibiotics and then patient will proceed to dental office for extraction of infected tooth  Right lower crack molar with infection -DC Unasyn and start patient on Augmentin if patient tolerates will discharge. -Patient follow-up with DDS Diona Browner for extraction of tooth #31 -Schedule appointment with DDS Diona Browner for extraction of molar (tooth #31) in 1-2 days  HTN -Norvasc 5 mg -HCTZ 25 mg daily -Irbesartan 300 mg daily  -Restart Coreg at 1/2 regular dose. 3.125 mg BID, will allow PCP to titrate to effect. -Schedule follow-up in 1-2 weeks with Dr. Lajean Manes, HTN, hypokalemia  Hypokalemia -Potassium goal> 4     Discharge Diagnoses:  Principal Problem:   Ludwig's angina Active Problems:   HTN (hypertension), benign   Coronary arteriosclerosis   Neck swelling   Closed fracture of tooth   Dental infection   Essential hypertension   Hypokalemia   Discharge Condition: Stable  Diet recommendation: Regular  Filed Weights   05/11/17 0958 05/11/17 1918  Weight: 218 lb (98.9 kg) 213 lb 1.6 oz (96.7 kg)    History of present illness:  56 y.o.WM PMHx CAD with NSTEMI in 2015, DES to LAD at that time, no further stents since that time; HTN; HLD, normal EF. HLD   Presents to the ED at [facility] with c/o R sided throat and neck swelling. Broke a tooth about 2 weeks ago, doesn't have dental insurance. Pain constant for last 2 weeks but significant worsening over last 3 days. R sided throat and jaw swelling worse over last 3 days. No fevers nor chills. Patient hospitalized secondary to fractured molar tooth #31 with  infection. Treated initially with IV antibiotics, defervesced. Will continue patient on outpatient PO antibiotics and refer to DDS Diona Browner evaluated patient in hospital and  agreed to extract tooth.    Procedures: None   Consultations: DDS Diona Browner ENT Dr Burley Saver   Antibiotics Anti-infectives    Start     Stop   05/12/17 1230  amoxicillin-clavulanate (AUGMENTIN) 875-125 MG per tablet 1 tablet         05/11/17 1400  Ampicillin-Sulbactam (UNASYN) 3 g in sodium chloride 0.9 % 100 mL IVPB  Status:  Discontinued     05/12/17 1227       Discharge Exam: Vitals:   05/13/17 0321 05/13/17 0400 05/13/17 0729 05/13/17 0800  BP:  (!) 144/90 (!) 157/90 (!) 152/80  Pulse: 80 62 85 73  Resp: 11 14 10 10   Temp: 97.6 F (36.4 C)  98 F (36.7 C)   TempSrc: Oral  Oral   SpO2: 97% 98% 94% 96%  Weight:      Height:        General: A/O 4, No acute respiratory distress Eyes: negative scleral hemorrhage, negative anisocoria, negative icterus ENT: Negative Runny nose, negative gingival bleeding, right lower first molar cracked, some swelling right mandible negative pain to palpation, positive right submandibular swelling Neck:  Negative scars, masses, torticollis, lymphadenopathy, JVD Lungs: Clear to auscultation bilaterally without wheezes or crackles Cardiovascular: Regular rate and rhythm without murmur gallop or rub normal S1 and S2    Discharge Instructions   Allergies as of 05/13/2017  No Known Allergies     Medication List    TAKE these medications   amLODipine 5 MG tablet Commonly known as:  NORVASC Take 5 mg by mouth daily.   amoxicillin-clavulanate 875-125 MG tablet Commonly known as:  AUGMENTIN Take 1 tablet by mouth every 12 (twelve) hours.   buPROPion 150 MG 24 hr tablet Commonly known as:  WELLBUTRIN XL Take 1 tablet by mouth daily.   carvedilol 3.125 MG tablet Commonly known as:  COREG Take 1 tablet (3.125 mg total) by mouth 2 (two) times  daily. What changed:  medication strength  how much to take  when to take this   clopidogrel 75 MG tablet Commonly known as:  PLAVIX Take 1 tablet (75 mg total) by mouth daily.   ezetimibe 10 MG tablet Commonly known as:  ZETIA Take 10 mg by mouth daily.   FISH OIL PO Take 1,000 mg by mouth daily.   hydrochlorothiazide 25 MG tablet Commonly known as:  HYDRODIURIL Take 1 tablet (25 mg total) by mouth daily.   irbesartan 300 MG tablet Commonly known as:  AVAPRO Take 300 mg by mouth daily.   nitroGLYCERIN 0.4 MG SL tablet Commonly known as:  NITROSTAT Place 1 tablet (0.4 mg total) under the tongue every 5 (five) minutes x 3 doses as needed. For chest pain   pravastatin 80 MG tablet Commonly known as:  PRAVACHOL Take 1 tablet (80 mg total) by mouth daily.      No Known Allergies Follow-up Information    Diona Browner, DDS. Go in 1 day(s).   Specialty:  Oral Surgery Why:  Appointment on 05/14/17 8am Contact information: [place] [address] [phone]        Lajean Manes, MD. Schedule an appointment as soon as possible for a visit in 2 week(s).   Specialty:  Internal Medicine Why:  Schedule follow-up in 1-2 weeks with Dr. Lajean Manes, HTN, hypokalemia Contact information: [address] [place] [address] [phone]            The results of significant diagnostics from this hospitalization (including imaging, microbiology, ancillary and laboratory) are listed below for reference.    Significant Diagnostic Studies: Ct Soft Tissue Neck W Contrast  Result Date: 05/11/2017 CLINICAL DATA:  Patient cracked tooth 2 weeks ago. Pain and swelling with induration to the RIGHT neck. Pain with swallowing. Elevated white count. Negative strep test. EXAM: CT NECK WITH CONTRAST TECHNIQUE: Multidetector CT imaging of the neck was performed using the standard protocol following the bolus administration of intravenous contrast.  CONTRAST:  162mL ISOVUE-300 IOPAMIDOL (ISOVUE-300) INJECTION 61% COMPARISON:  None. FINDINGS: Pharynx and larynx: Clinically, ordering provider reports poor dentition. Assessment with CT is difficult due to beam hardening, however RIGHT mandibular periapical lucencies are suggested, along with a possible dental caries of a RIGHT mandibular molar. Edema in the region of the RIGHT base of tongue, with mass effect on the RIGHT hypopharynx, surrounding the RIGHT submandibular gland, with stranding of the fat and RIGHT platysma. Slight effacement of the airway is noted from RIGHT to LEFT, for instance image 48 series 3 There is no discrete abscess. Overall the findings are most consistent with dental related floor of mouth inflammatory phlegmon. If untreated, further swelling and abscess could develop leading to airway compromise and Ludwig's angina. Salivary glands: No intrinsic inflammation, mass, or stone. Thyroid: There is a large thyroid nodule, partially calcified, measuring 20 x 24 x 30 mm. Further evaluation with sonography  and tissue sampling is warranted. Lymph nodes: Reactive lymphadenopathy in level 1 and level 2, greater on the RIGHT Vascular: Patent. Limited intracranial: Negative. Visualized orbits: Negative. Mastoids and visualized paranasal sinuses: Negative. Skeleton: Spondylosis.  No worrisome osseous findings. Upper chest: Negative. Other: None. IMPRESSION: In this patient with poor dentition, the mass effect observed in the RIGHT floor of mouth, involving the submandibular, and parapharyngeal spaces primarily, with edema extending into the base of tongue is most consistent with a dental related inflammatory phlegmon. No abscess is seen. ENT consultation is warranted, to monitor closely for airway compromise and/or development of Ludwig's angina. Findings discussed with ordering provider. 2 x 2.5 x 3 cm RIGHT thyroid nodule.  Tissue sampling is warranted. Electronically Signed   By: Staci Righter M.D.    On: 05/11/2017 12:54    Microbiology: Recent Results (from the past 240 hour(s))  Rapid strep screen     Status: None   Collection Time: 05/11/17 11:25 AM  Result Value Ref Range Status   Streptococcus, Group A Screen (Direct) NEGATIVE NEGATIVE Final    Comment: (NOTE) A Rapid Antigen test may result negative if the antigen level in the sample is below the detection level of this test. The FDA has not cleared this test as a stand-alone test therefore the rapid antigen negative result has reflexed to a Group A Strep culture.   Culture, group A strep     Status: None (Preliminary result)   Collection Time: 05/11/17 11:25 AM  Result Value Ref Range Status   Specimen Description THROAT  Final   Special Requests NONE Reflexed from [id number]  Final   Culture   Final    CULTURE REINCUBATED FOR BETTER GROWTH Performed at [facility], [place] [address] [place], [place] [address]    Report Status PENDING  Incomplete     Labs: Basic Metabolic Panel:  Recent Labs Lab 05/11/17 1110 05/12/17 1002 05/12/17 1327 05/13/17 0134  NA 138 137 138  --   K 3.4* 3.3* 3.8  --   CL 103 104 103  --   CO2 22 25 26   --   GLUCOSE 98 159* 90  --   BUN 13 19 19   --   CREATININE 0.92 0.98 0.94  --   CALCIUM 9.0 8.8* 9.0  --   MG  --  2.2  --  2.2   Liver Function Tests: No results for input(s): AST, ALT, ALKPHOS, BILITOT, PROT, ALBUMIN in the last 168 hours. No results for input(s): LIPASE, AMYLASE in the last 168 hours. No results for input(s): AMMONIA in the last 168 hours. CBC:  Recent Labs Lab 05/11/17 1110 05/12/17 1002 05/13/17 0134  WBC 14.1* 18.3* 14.8*  NEUTROABS 11.8* 16.3*  --   HGB 15.1 13.5 12.8*  HCT 43.5 40.8 39.1  MCV 88.1 89.9 90.5  PLT 194 240 230   Cardiac Enzymes: No results for input(s): CKTOTAL, CKMB, CKMBINDEX, TROPONINI in the last 168 hours. BNP: BNP (last 3 results) No results for input(s): BNP in the last 8760 hours.  ProBNP (last 3 results) No  results for input(s): PROBNP in the last 8760 hours.  CBG:  Recent Labs Lab 05/11/17 2329 05/12/17 0510 05/12/17 0807 05/12/17 1234 05/12/17 1628  GLUCAP 181* 123* 191* 90 160*       Signed:  Dia Crawford, MD [facility] [phone] pager

## 2017-08-22 DIAGNOSIS — E7800 Pure hypercholesterolemia, unspecified: Secondary | ICD-10-CM | POA: Diagnosis not present

## 2017-08-22 DIAGNOSIS — Z0000 Encounter for general adult medical examination without abnormal findings: Secondary | ICD-10-CM | POA: Diagnosis not present

## 2017-08-22 DIAGNOSIS — Z23 Encounter for immunization: Secondary | ICD-10-CM | POA: Diagnosis not present

## 2017-09-25 ENCOUNTER — Other Ambulatory Visit: Payer: Self-pay | Admitting: Interventional Cardiology

## 2017-10-01 ENCOUNTER — Ambulatory Visit: Payer: 59 | Admitting: Interventional Cardiology

## 2017-10-01 ENCOUNTER — Encounter: Payer: Self-pay | Admitting: Interventional Cardiology

## 2017-10-01 VITALS — BP 130/72 | HR 85 | Ht 72.0 in | Wt 226.8 lb

## 2017-10-01 DIAGNOSIS — I25118 Atherosclerotic heart disease of native coronary artery with other forms of angina pectoris: Secondary | ICD-10-CM

## 2017-10-01 DIAGNOSIS — I209 Angina pectoris, unspecified: Secondary | ICD-10-CM

## 2017-10-01 DIAGNOSIS — I252 Old myocardial infarction: Secondary | ICD-10-CM

## 2017-10-01 DIAGNOSIS — E782 Mixed hyperlipidemia: Secondary | ICD-10-CM

## 2017-10-01 DIAGNOSIS — I10 Essential (primary) hypertension: Secondary | ICD-10-CM | POA: Diagnosis not present

## 2017-10-01 MED ORDER — NITROGLYCERIN 0.4 MG SL SUBL: 0.4000 mg | SUBLINGUAL_TABLET | SUBLINGUAL | 3 refills | Status: DC | PRN

## 2017-10-01 MED ORDER — PRAVASTATIN SODIUM 80 MG PO TABS: 80.0000 mg | ORAL_TABLET | Freq: Every day | ORAL | 3 refills | Status: DC

## 2017-10-01 MED ORDER — HYDROCHLOROTHIAZIDE 25 MG PO TABS: 25.0000 mg | ORAL_TABLET | Freq: Every day | ORAL | 3 refills | Status: DC

## 2017-10-01 MED ORDER — CLOPIDOGREL BISULFATE 75 MG PO TABS: 75.0000 mg | ORAL_TABLET | Freq: Every day | ORAL | 3 refills | Status: DC

## 2017-10-01 MED ORDER — IRBESARTAN 300 MG PO TABS: 300.0000 mg | ORAL_TABLET | Freq: Every day | ORAL | 3 refills | Status: DC

## 2017-10-01 MED ORDER — CARVEDILOL 3.125 MG PO TABS: 3.1250 mg | ORAL_TABLET | Freq: Two times a day (BID) | ORAL | 3 refills | Status: DC

## 2017-10-01 NOTE — Progress Notes (Signed)
Cardiology Office Note   Date:  10/01/2017   Donald:  Donald Torres, DOB [birth year], MRN Torres  PCP:  Donald Manes, MD    No chief complaint on file. CAD   Wt Readings from Last 3 Encounters:  10/01/17 226 lb 12.8 oz (102.9 kg)  05/11/17 213 lb 1.6 oz (96.7 kg)  09/14/16 225 lb (102.1 kg)       History of Present Illness: Donald Torres is a 56 y.o. male  with a history of CAD, HTN and tobacco abuse. He was admitted 06/2014 with a non-STEMI. LHC demonstrated a 90% lesion in the LAD with mobile thrombus that was treated with aspiration thrombectomy and a DES. EF was normal.  His wife, Donald Torres works at Dr. [employer]arlyle Torres office.  He had leg pains that were attributed to lipitor.    He continued to have pains in his legs. The cholesterol had gone up so he went back on pravastatin.  He has done well for several years since the stent in 2015.  He was hospitalized in 2018, July with a dental infection and abscess.  He had an episode a few days ago where he had arm numbness.  Sx lasted 20 seconds, but felt like his prior MI.  He then took a NTG after his sx had already resolved.  He took some aspirin.  He has had worsening fatigue since that episode.  He has gone back to work, without any more arm symptoms.  Prior to this, he had been working normally.  It was more stressful than usual, due to the season.  BP has been controlled.  No bleeding issues.    Past Medical History:  Diagnosis Date  . CAD (coronary artery disease)    NSTEMI 06/2014 >>> LHC (9/15):  Mid LAD 90% with mobile thrombus, EF 60% >> PCI:  Aspiration thrombectomy + 2.75 x 28 Promus DES to mid LAD  . High cholesterol   . Hx of echocardiogram    Echo (9/15):  EF 55-60%, no RWMA, Gr 1 DD, mild BAE  . Hypertension     Past Surgical History:  Procedure Laterality Date  . HERNIA REPAIR    . LEFT HEART CATHETERIZATION WITH CORONARY ANGIOGRAM N/A 07/26/2014   Procedure: LEFT HEART CATHETERIZATION WITH CORONARY  ANGIOGRAM;  Surgeon: Donald Booze, MD;  Location: [facility] CATH LAB;  Service: Cardiovascular;  Laterality: N/A;     Current Outpatient Medications  Medication Sig Dispense Refill  . amLODipine (NORVASC) 5 MG tablet Take 5 mg by mouth daily.    Marland Kitchen buPROPion (WELLBUTRIN XL) 150 MG 24 hr tablet Take 1 tablet by mouth daily.    . carvedilol (COREG) 3.125 MG tablet Take 1 tablet (3.125 mg total) by mouth 2 (two) times daily. 60 tablet 0  . clopidogrel (PLAVIX) 75 MG tablet Take 1 tablet (75 mg total) by mouth daily. Please make an appt with Donald Torres for more refills. 1st attempt 30 tablet 0  . ezetimibe (ZETIA) 10 MG tablet Take 10 mg by mouth daily.    . hydrochlorothiazide (HYDRODIURIL) 25 MG tablet Take 1 tablet (25 mg total) by mouth daily. Please make an appt with Donald Torres for more refills. 1st attempt 30 tablet 0  . irbesartan (AVAPRO) 300 MG tablet Take 300 mg by mouth daily.    . nitroGLYCERIN (NITROSTAT) 0.4 MG SL tablet Place 1 tablet (0.4 mg total) under the tongue every 5 (five) minutes x 3 doses as needed. For chest pain 25  tablet 3  . Omega-3 Fatty Acids (FISH OIL PO) Take 1,000 mg by mouth daily.     . pravastatin (PRAVACHOL) 80 MG tablet Take 1 tablet (80 mg total) by mouth daily. 30 tablet 0   No current facility-administered medications for this visit.     Allergies:   Patient has no known allergies.    Social History:  The patient  reports that he quit smoking about 3 years ago. He has a 36.00 pack-year smoking history. he has never used smokeless tobacco. He reports that he drinks alcohol. He reports that he does not use drugs.   Family History:  The patient's family history includes Cancer in his mother; Diabetes in his brother; Heart attack in his brother, father, and unknown relative; Hypertension in his father and mother.    ROS:  Please see the history of present illness.   Otherwise, review of systems are positive for episode of arm numbness as noted above.    All other systems are reviewed and negative.    PHYSICAL EXAM: VS:  BP 130/72   Pulse 85   Ht 6' (1.829 m)   Wt 226 lb 12.8 oz (102.9 kg)   SpO2 97%   BMI 30.76 kg/m  , BMI Body mass index is 30.76 kg/m. GEN: Well nourished, well developed, in no acute distress  HEENT: normal  Neck: no JVD, carotid bruits, or masses Cardiac: RRR; 1/6 systoic murmur, no rubs, or gallops,no edema  Respiratory:  clear to auscultation bilaterally, normal work of breathing GI: soft, nontender, nondistended, + BS MS: no deformity or atrophy  Skin: warm and dry, no rash Neuro:  Strength and sensation are intact Psych: euthymic mood, full affect   EKG:   The ekg ordered today demonstrates NSR, no significant ST changes, no changes from prior   Recent Labs: 05/12/2017: BUN 19; Creatinine, Ser 0.94; Potassium 3.8; Sodium 138 05/13/2017: Hemoglobin 12.8; Magnesium 2.2; Platelets 230   Lipid Panel    Component Value Date/Time   CHOL 165 07/18/2015 0924   TRIG 137.0 07/18/2015 0924   HDL 42.00 07/18/2015 0924   CHOLHDL 4 07/18/2015 0924   VLDL 27.4 07/18/2015 0924   LDLCALC 96 07/18/2015 0924     Other studies Reviewed: Additional studies/ records that were reviewed today with results demonstrating: July 2018 echo showing no RWMA, normal LVEF..   ASSESSMENT AND PLAN:  1. CAD/Old MI: Renew NTG.  Went over NTG precautions. Plan for exercise nuclear stress test given symptoms that he had. We did discuss cardiac cath but since his symptoms were so short-lived and resolve spontaneously, will plan for nuclear stress test. He has not had any recurrence. 2. Hyperlipidemia: LDL 117 in October 2018. This is above target.  Will refer to lipid clinic once his chest discomfort is sorted out.   He is on maximum dose pravastatin. May need to consider rosuvastatin or potentially PCS K9 inhibitor given history of MI. 3. HTN: Well controlled.  Continue current meds.   Current medicines are reviewed at length  with the patient today.  The patient concerns regarding his medicines were addressed.  The following changes have been made:  No change  Labs/ tests ordered today include: nuclear stress test No orders of the defined types were placed in this encounter.   Recommend 150 minutes/week of aerobic exercise Low fat, low carb, high fiber diet recommended  Disposition:   FU for stress test   Signed, Larae Grooms, MD  10/01/2017 9:38 AM  [facility] [place], [place], [place]  [address] Phone: [phone]; Fax: [phone]

## 2017-10-01 NOTE — Patient Instructions (Signed)
Medication Instructions:  Your physician recommends that you continue on your current medications as directed. Please refer to the Current Medication list given to you today.   Labwork: None ordered  Testing/Procedures: Your physician has requested that you have en exercise stress myoview. For further information please visit [email or web address]. Please follow instruction sheet, as given.  Follow-Up: Based on test results   Any Other Special Instructions Will Be Listed Below (If Applicable).     If you need a refill on your cardiac medications before your next appointment, please call your pharmacy.   

## 2017-10-16 ENCOUNTER — Telehealth (HOSPITAL_COMMUNITY): Payer: Self-pay | Admitting: *Deleted

## 2017-10-16 NOTE — Telephone Encounter (Signed)
Patient given detailed instructions per Myocardial Perfusion Study Information Sheet for the test on 10/24/17 patient notified to arrive 15 minutes early and that it is imperative to arrive on time for appointment to keep from having the test rescheduled.  If you need to cancel or reschedule your appointment, please call the office within 24 hours of your appointment. . Patient verbalized understanding. Kirstie Peri, RN

## 2017-10-17 ENCOUNTER — Other Ambulatory Visit: Payer: Self-pay | Admitting: Interventional Cardiology

## 2017-10-17 NOTE — Telephone Encounter (Signed)
Medication Detail    Disp Refills Start End   carvedilol (COREG) 3.125 MG tablet 180 tablet 3 10/01/2017 10/01/2018   Sig - Route: Take 1 tablet (3.125 mg total) by mouth 2 (two) times daily. - Oral   Sent to pharmacy as: carvedilol (COREG) 3.125 MG tablet   E-Prescribing Status: Receipt confirmed by pharmacy (10/01/2017 9:56 AM EST)   Pharmacy   [facility] [place], [place]

## 2017-10-24 ENCOUNTER — Ambulatory Visit (HOSPITAL_COMMUNITY): Payer: 59 | Attending: Cardiovascular Disease

## 2017-10-24 DIAGNOSIS — R079 Chest pain, unspecified: Secondary | ICD-10-CM | POA: Insufficient documentation

## 2017-10-24 DIAGNOSIS — R5383 Other fatigue: Secondary | ICD-10-CM | POA: Insufficient documentation

## 2017-10-24 DIAGNOSIS — R0609 Other forms of dyspnea: Secondary | ICD-10-CM | POA: Insufficient documentation

## 2017-10-24 DIAGNOSIS — I2510 Atherosclerotic heart disease of native coronary artery without angina pectoris: Secondary | ICD-10-CM | POA: Diagnosis not present

## 2017-10-24 DIAGNOSIS — I10 Essential (primary) hypertension: Secondary | ICD-10-CM | POA: Diagnosis not present

## 2017-10-24 DIAGNOSIS — I209 Angina pectoris, unspecified: Secondary | ICD-10-CM

## 2017-10-24 LAB — MYOCARDIAL PERFUSION IMAGING
CHL CUP MPHR: 166 {beats}/min
CHL CUP NUCLEAR SDS: 1
CHL CUP NUCLEAR SRS: 1
CHL CUP RESTING HR STRESS: 72 {beats}/min
CSEPED: 4 min
CSEPEW: 5 METS
CSEPHR: 90 %
CSEPPHR: 150 {beats}/min
Exercise duration (sec): 30 s
LHR: 0.33
LV sys vol: 26 mL
LVDIAVOL: 85 mL (ref 62–150)
SSS: 2
TID: 0.95

## 2017-10-24 MED ORDER — TECHNETIUM TC 99M TETROFOSMIN IV KIT
31.7000 | PACK | Freq: Once | INTRAVENOUS | Status: AC | PRN
  Administered 2017-10-24: 31.7 via INTRAVENOUS
  Filled 2017-10-24: qty 32

## 2017-10-24 MED ORDER — TECHNETIUM TC 99M TETROFOSMIN IV KIT
10.6000 | PACK | Freq: Once | INTRAVENOUS | Status: AC | PRN
  Administered 2017-10-24: 10.6 via INTRAVENOUS
  Filled 2017-10-24: qty 11

## 2017-10-28 ENCOUNTER — Telehealth: Payer: Self-pay

## 2017-10-28 NOTE — Telephone Encounter (Signed)
Called patient to follow up on BP readings and carvedilol dose. Patient did not answer. Left message for patient to call back.

## 2017-10-30 NOTE — Telephone Encounter (Signed)
Called patient again to follow up on BP readings and carvedilol dose. Patient did not answer. Left message for patient to call back.

## 2017-10-30 NOTE — Telephone Encounter (Signed)
Received walk-in paper last Friday stating that the patient went to fill his carvedilol and did not realize that his carvedilol was decreased from 6.25 mg BID to 3.125 mg BID. Looking back in the chart this change was made when the patient was d/c'd from the hospital in July. I had advised patient to monitor BP and HR while taking the carvedilol 3.125 mg BID. Patient calling back to report readings.  128/75  85 109/70  83 120/85  80 128/80  78 135/85  79  Patient denies having any symptoms or complaints at this time. Advised patient to continue taking the carvedilol 3.125 mg BID and let us know if develops any symptoms or concerns. Patient verbalized understanding and thanked me for the call.

## 2017-11-05 NOTE — Telephone Encounter (Signed)
I agree with the lower dose of Coreg.

## 2017-11-18 DIAGNOSIS — G4733 Obstructive sleep apnea (adult) (pediatric): Secondary | ICD-10-CM | POA: Diagnosis not present

## 2017-11-20 DIAGNOSIS — Z79899 Other long term (current) drug therapy: Secondary | ICD-10-CM | POA: Diagnosis not present

## 2017-11-20 DIAGNOSIS — E7800 Pure hypercholesterolemia, unspecified: Secondary | ICD-10-CM | POA: Diagnosis not present

## 2017-11-27 DIAGNOSIS — G4733 Obstructive sleep apnea (adult) (pediatric): Secondary | ICD-10-CM | POA: Diagnosis not present

## 2017-12-18 DIAGNOSIS — G4733 Obstructive sleep apnea (adult) (pediatric): Secondary | ICD-10-CM | POA: Diagnosis not present

## 2017-12-26 DIAGNOSIS — G4733 Obstructive sleep apnea (adult) (pediatric): Secondary | ICD-10-CM | POA: Diagnosis not present

## 2018-01-06 DIAGNOSIS — L918 Other hypertrophic disorders of the skin: Secondary | ICD-10-CM | POA: Diagnosis not present

## 2018-01-25 DIAGNOSIS — G4733 Obstructive sleep apnea (adult) (pediatric): Secondary | ICD-10-CM | POA: Diagnosis not present

## 2018-02-17 DIAGNOSIS — G4733 Obstructive sleep apnea (adult) (pediatric): Secondary | ICD-10-CM | POA: Diagnosis not present

## 2018-02-17 DIAGNOSIS — I10 Essential (primary) hypertension: Secondary | ICD-10-CM | POA: Diagnosis not present

## 2018-02-20 DIAGNOSIS — E7800 Pure hypercholesterolemia, unspecified: Secondary | ICD-10-CM | POA: Diagnosis not present

## 2018-02-20 DIAGNOSIS — G4730 Sleep apnea, unspecified: Secondary | ICD-10-CM | POA: Diagnosis not present

## 2018-02-20 DIAGNOSIS — Z79899 Other long term (current) drug therapy: Secondary | ICD-10-CM | POA: Diagnosis not present

## 2018-02-20 DIAGNOSIS — I10 Essential (primary) hypertension: Secondary | ICD-10-CM | POA: Diagnosis not present

## 2018-02-25 DIAGNOSIS — R7303 Prediabetes: Secondary | ICD-10-CM | POA: Diagnosis not present

## 2018-02-25 DIAGNOSIS — G4733 Obstructive sleep apnea (adult) (pediatric): Secondary | ICD-10-CM | POA: Diagnosis not present

## 2018-02-27 DIAGNOSIS — E7800 Pure hypercholesterolemia, unspecified: Secondary | ICD-10-CM | POA: Diagnosis not present

## 2018-02-27 DIAGNOSIS — E1169 Type 2 diabetes mellitus with other specified complication: Secondary | ICD-10-CM | POA: Diagnosis not present

## 2018-02-27 DIAGNOSIS — I10 Essential (primary) hypertension: Secondary | ICD-10-CM | POA: Diagnosis not present

## 2018-03-12 DIAGNOSIS — G4733 Obstructive sleep apnea (adult) (pediatric): Secondary | ICD-10-CM | POA: Diagnosis not present

## 2018-03-27 DIAGNOSIS — G4733 Obstructive sleep apnea (adult) (pediatric): Secondary | ICD-10-CM | POA: Diagnosis not present

## 2018-04-27 DIAGNOSIS — G4733 Obstructive sleep apnea (adult) (pediatric): Secondary | ICD-10-CM | POA: Diagnosis not present

## 2018-05-27 DIAGNOSIS — G4733 Obstructive sleep apnea (adult) (pediatric): Secondary | ICD-10-CM | POA: Diagnosis not present

## 2018-05-31 IMAGING — CT CT NECK W/ CM
3 of 5 series · 12 of 33 positions shown, 14 images · IV contrast (iopamidol)
Comparison: None.

CLINICAL DATA: Patient cracked tooth 2 weeks ago. Pain and swelling
with induration to the RIGHT neck. Pain with swallowing. Elevated
white count. Negative strep test.

EXAM:
CT NECK WITH CONTRAST
TECHNIQUE: Multidetector CT imaging of the neck was performed using the
standard protocol following the bolus administration of intravenous
contrast.
CONTRAST:  100mL 6P8L59-EFF IOPAMIDOL (6P8L59-EFF) INJECTION 61%

[Series 7: sag neck · sagittal · 0.45mm/px · 5 of 77 slices shown, 6 images]
[im 26/77  bone]
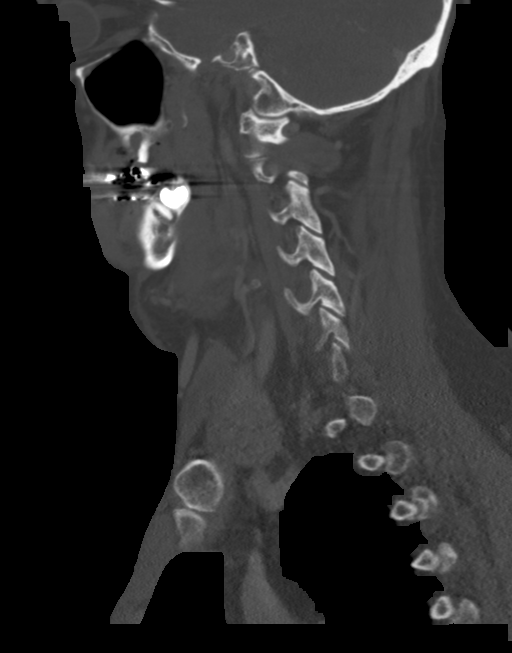
[im 32/77  bone]
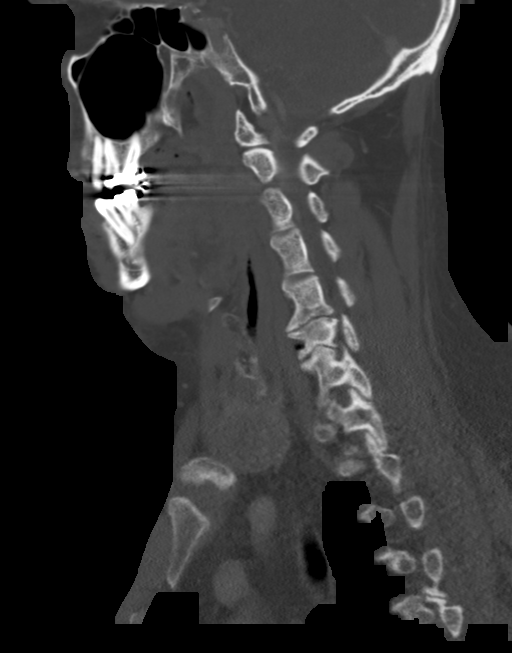
[im 39/77  soft-tissue]
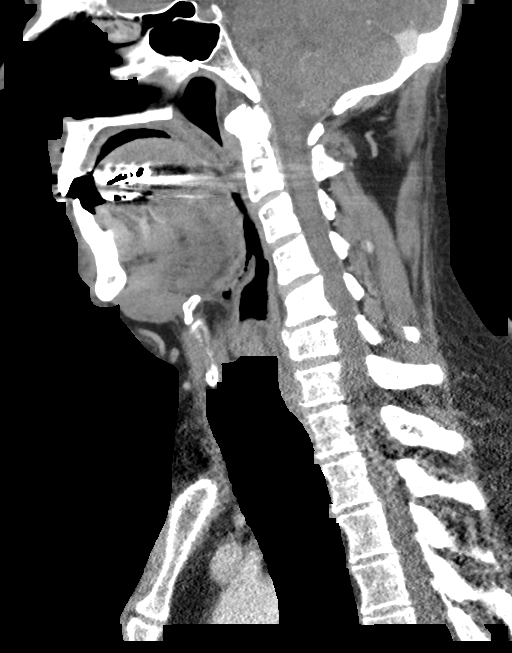
[im 39/77  bone]
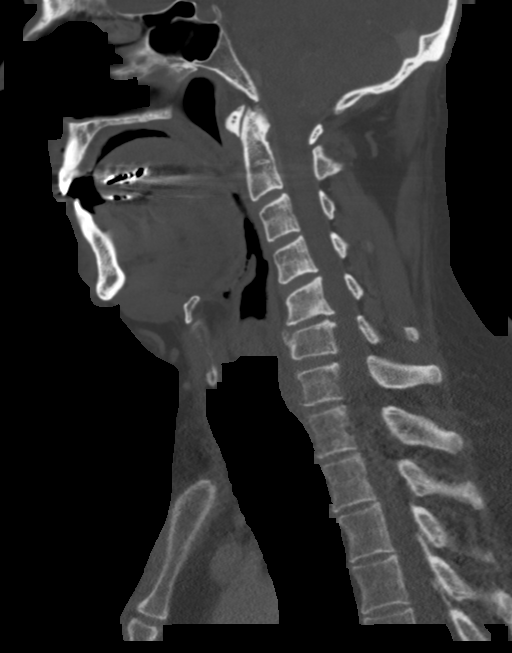
[im 45/77  bone]
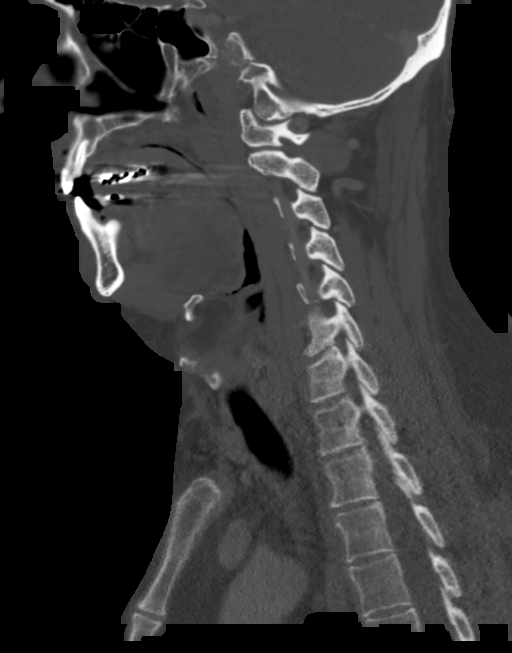
[im 51/77  bone]
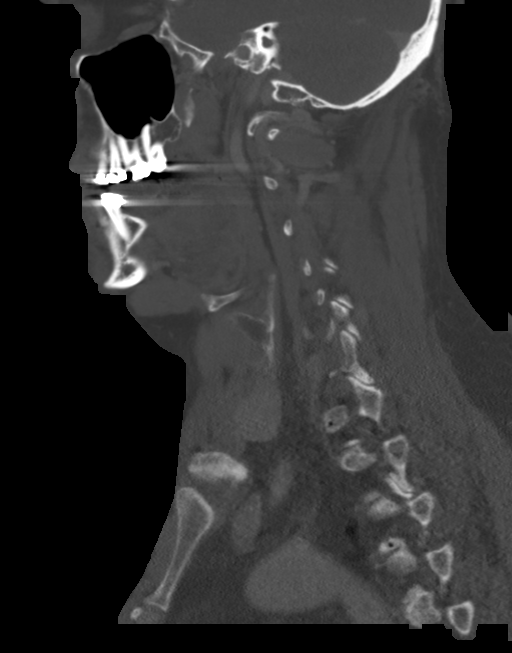

[Series 8: cor neck · coronal · 0.42mm/px · 3 of 94 slices shown]
[im 19/94  bone]
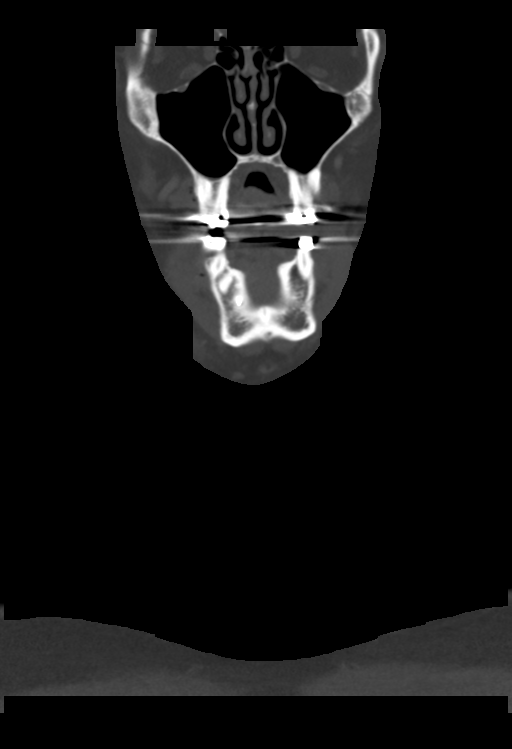
[im 38/94  bone]
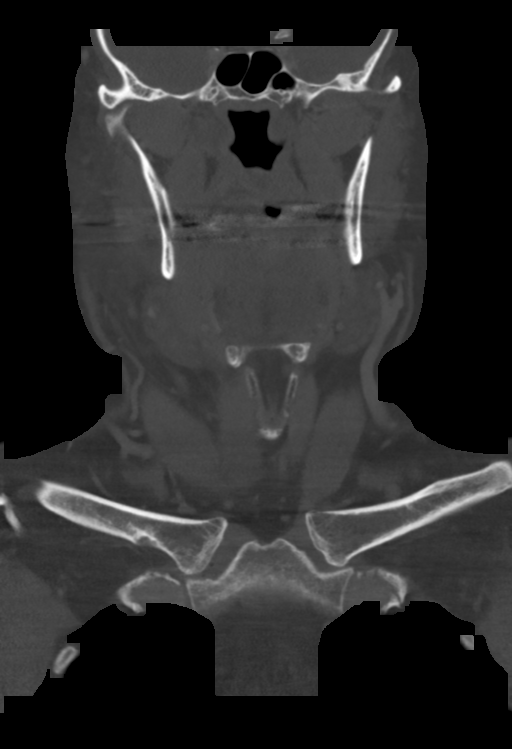
[im 56/94  bone]
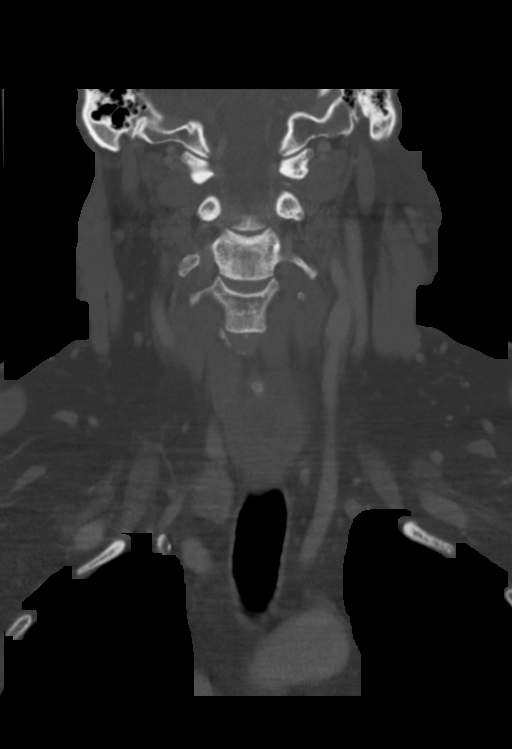

[Series 9: orthogonal ax · axial · 0.39mm/px · z∈[-370,-150]mm · 4 of 180 slices shown, 5 images]
[im 30/180  soft-tissue]
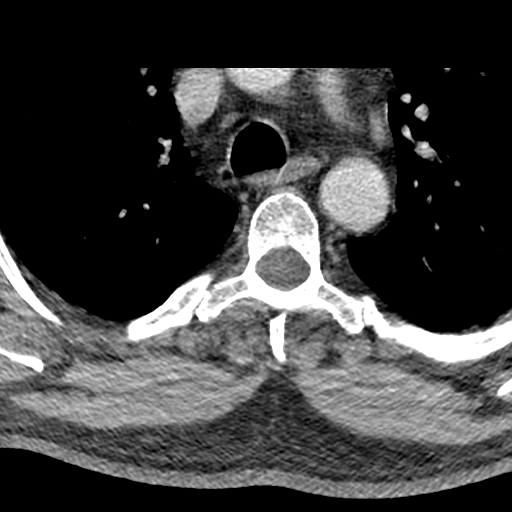
[im 30/180  bone]
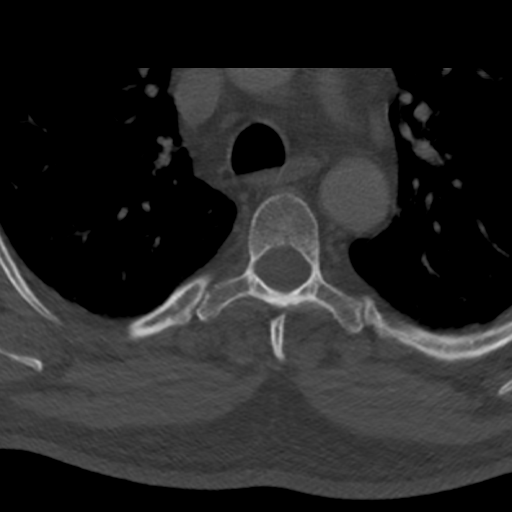
[im 60/180  bone]
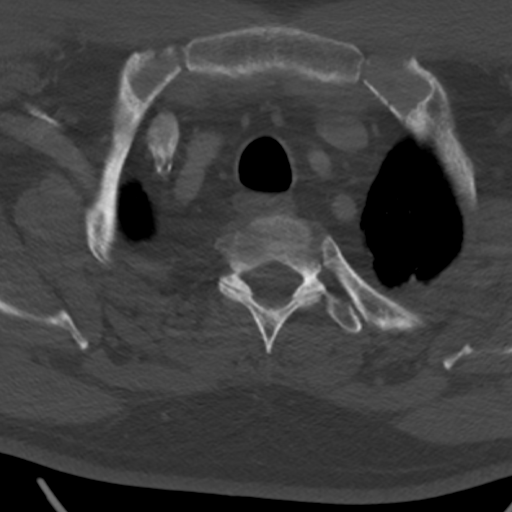
[im 120/180  bone]
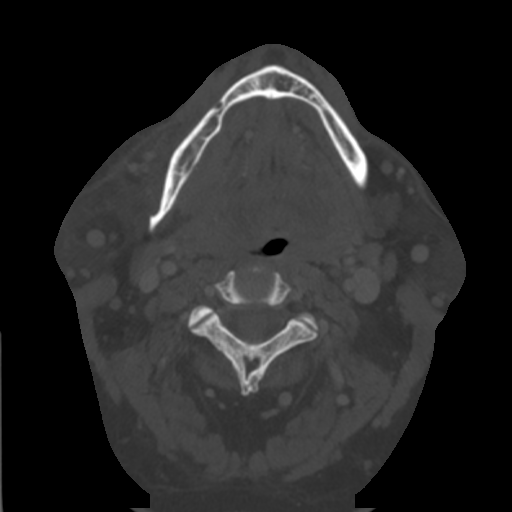
[im 150/180  bone]
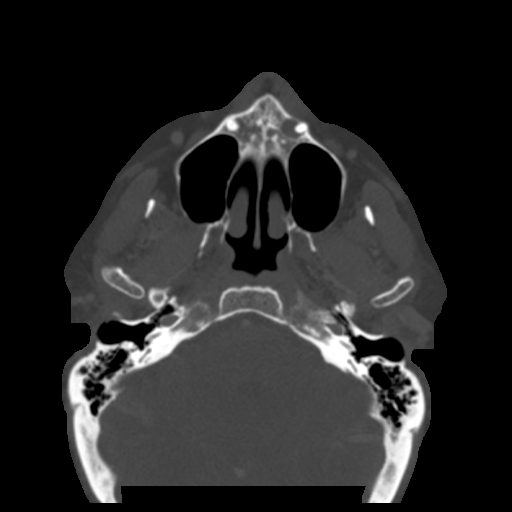

[12 of 33 positions shown; findings below may reference images not displayed]

FINDINGS: Pharynx and larynx: Clinically, ordering provider reports poor
dentition. Assessment with CT is difficult due to beam hardening,
however RIGHT mandibular periapical lucencies are suggested, along
with a possible dental caries of a RIGHT mandibular molar. Edema in
the region of the RIGHT base of tongue, with mass effect on the
RIGHT hypopharynx, surrounding the RIGHT submandibular gland, with
stranding of the fat and RIGHT platysma. Slight effacement of the
airway is noted from RIGHT to LEFT, for instance image 48 series 3
There is no discrete abscess. Overall the findings are most
consistent with dental related floor of mouth inflammatory phlegmon.
If untreated, further swelling and abscess could develop leading to
airway compromise and Ludwig's angina.

Salivary glands: No intrinsic inflammation, mass, or stone.

Thyroid: There is a large thyroid nodule, partially calcified,
measuring 20 x 24 x 30 mm. Further evaluation with sonography and
tissue sampling is warranted.

Lymph nodes: Reactive lymphadenopathy in level 1 and level 2,
greater on the RIGHT

Vascular: Patent.

Limited intracranial: Negative.

Visualized orbits: Negative.

Mastoids and visualized paranasal sinuses: Negative.

Skeleton: Spondylosis.  No worrisome osseous findings.

Upper chest: Negative.

Other: None.
IMPRESSION: In this patient with poor dentition, the mass effect observed in the
RIGHT floor of mouth, involving the submandibular, and
parapharyngeal spaces primarily, with edema extending into the base
of tongue is most consistent with a dental related inflammatory
phlegmon. No abscess is seen. ENT consultation is warranted, to
monitor closely for airway compromise and/or development of Ludwig's
angina. Findings discussed with ordering provider.

2 x 2.5 x 3 cm RIGHT thyroid nodule.  Tissue sampling is warranted.

## 2018-06-27 DIAGNOSIS — G4733 Obstructive sleep apnea (adult) (pediatric): Secondary | ICD-10-CM | POA: Diagnosis not present

## 2018-07-03 ENCOUNTER — Telehealth: Payer: Self-pay | Admitting: *Deleted

## 2018-07-03 NOTE — Telephone Encounter (Signed)
   [facility] Pre-operative Risk Assessment    Request for surgical clearance:  1. What type of surgery is being performed? COLONOSCOPY   2. When is this surgery scheduled?  07/14/18   3. What type of clearance is required (medical clearance vs. Pharmacy clearance to hold med vs. Both)?  PHARMACY  4. Are there any medications that need to be held prior to surgery and how long? PLAVIX X'S 4 DAYS   5. Practice name and name of physician performing surgery?  [facility]   6. What is your office phone number [phone]    7.   What is your office fax number [phone]  8.   Anesthesia type (None, local, MAC, general) ?     Jeanann Lewandowsky 07/03/2018, 11:08 AM  _________________________________________________________________   (provider comments below)

## 2018-07-04 NOTE — Telephone Encounter (Signed)
   Primary [place], MD (last seen 10/01/17)  Chart reviewed as part of pre-operative protocol coverage. Because of Donald Torres's past medical history and time since last visit, he/she will require a follow-up visit in order to better assess preoperative cardiovascular risk.  Pre-op covering staff: - Please schedule appointment and call patient to inform them. - Please contact requesting surgeon's office via preferred method (i.e, phone, fax) to inform them of need for appointment prior to surgery.  [place], [place]  07/04/2018, 8:18 AM

## 2018-07-04 NOTE — Telephone Encounter (Signed)
Spoke with patient re: appointment with Donald Torres on 9/10 @ 2 pm. Pt verbalized understanding.

## 2018-07-04 NOTE — Telephone Encounter (Signed)
Sent fax via Epic and manual.

## 2018-07-07 ENCOUNTER — Encounter: Payer: Self-pay | Admitting: Cardiology

## 2018-07-08 ENCOUNTER — Encounter: Payer: Self-pay | Admitting: Cardiology

## 2018-07-08 ENCOUNTER — Ambulatory Visit: Payer: 59 | Admitting: Cardiology

## 2018-07-08 VITALS — BP 126/84 | HR 88 | Ht 72.0 in | Wt 224.4 lb

## 2018-07-08 DIAGNOSIS — I2510 Atherosclerotic heart disease of native coronary artery without angina pectoris: Secondary | ICD-10-CM | POA: Diagnosis not present

## 2018-07-08 DIAGNOSIS — E785 Hyperlipidemia, unspecified: Secondary | ICD-10-CM | POA: Diagnosis not present

## 2018-07-08 DIAGNOSIS — I10 Essential (primary) hypertension: Secondary | ICD-10-CM | POA: Diagnosis not present

## 2018-07-08 MED ORDER — ROSUVASTATIN CALCIUM 10 MG PO TABS: 10.0000 mg | ORAL_TABLET | Freq: Every day | ORAL | 3 refills | Status: AC

## 2018-07-08 NOTE — Progress Notes (Signed)
Cardiology Office Note:    Date:  07/08/2018   ID:  Donald Torres, DOB [birth year], MRN [id number]  PCP:  Lajean Manes, MD  Cardiologist:  Larae Grooms, MD  Referring MD: Lajean Manes, MD   Chief Complaint  Patient presents with  . Pre-op Exam    History of Present Illness:    Donald Torres is a 57 y.o. male with a past medical history significant for CAD, HTN, and tobacco use. He was admitted 06/2014 with a non-STEMI. LHC demonstrated a 90% lesion in the LAD with mobile thrombus that was treated with aspiration thrombectomy and a DES. EF was normal.  He was last seen by Dr. Irish Lack on 10/01/2017 at which time he had complaints of very brief arm numbness. He underwent exercise myoview which was normal.   He is here today for pre-op evaluation prior to colonoscopy Planned for Monday 07/14/18. 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 He tries to walk outside for 1 mile when he can, at least twice a week. He has no chest pain/pressure/tightness with exertion. He has mild long standing shortness of breath with walking up hill or doing more intense exertion. He relates this to being a long time smoker. No orthopnea, PND. He has mild pretibial edema at the end of a long day, better in the morning. He uses CPAP every night. No palpitations, lightheadedness, syncope.   He quit smoking in 2015. He wants to lose some weight. He feels that his food choices are good but he eats too much. He is followed by Dr. Felipa Eth who follows his labs. He stopped pravastatin due to pain in his hips. He tried a reduced dose from 80 mg to 40 mg with little improvement. He went off statin and now has no problem. He also did not tolerate atorvastatin. He still takes zetia.  Home BP's run 130/80, before his am medications.   Past Medical History:  Diagnosis Date  . CAD (coronary artery disease)    NSTEMI 06/2014 >>> LHC (9/15):  Mid LAD 90% with mobile  thrombus, EF 60% >> PCI:  Aspiration thrombectomy + 2.75 x 28 Promus DES to mid LAD  . High cholesterol   . Hx of echocardiogram    Echo (9/15):  EF 55-60%, no RWMA, Gr 1 DD, mild BAE  . Hypertension     Past Surgical History:  Procedure Laterality Date  . HERNIA REPAIR    . LEFT HEART CATHETERIZATION WITH CORONARY ANGIOGRAM N/A 07/26/2014   Procedure: LEFT HEART CATHETERIZATION WITH CORONARY ANGIOGRAM;  Surgeon: Jettie Booze, MD;  Location: [facility] CATH LAB;  Service: Cardiovascular;  Laterality: N/A;    Current Medications: Current Meds  Medication Sig  . amLODipine (NORVASC) 5 MG tablet Take 5 mg by mouth daily.  . carvedilol (COREG) 3.125 MG tablet Take 1 tablet (3.125 mg total) by mouth 2 (two) times daily.  . clopidogrel (PLAVIX) 75 MG tablet Take 1 tablet (75 mg total) by mouth daily.  Marland Kitchen ezetimibe (ZETIA) 10 MG tablet Take 10 mg by mouth daily.  . hydrochlorothiazide (HYDRODIURIL) 25 MG tablet Take 1 tablet (25 mg total) by mouth daily.  . irbesartan (AVAPRO) 300 MG tablet Take 1 tablet (300 mg total) by mouth daily.  . nitroGLYCERIN (NITROSTAT) 0.4 MG SL tablet Place 1 tablet (0.4 mg total) under the tongue every 5 (five) minutes x 3 doses as needed. For chest pain  . Omega-3 Fatty Acids (FISH  OIL PO) Take 1,000 mg by mouth daily.   . [DISCONTINUED] pravastatin (PRAVACHOL) 80 MG tablet Take 1 tablet (80 mg total) by mouth daily.     Allergies:   Patient has no known allergies.   Social History   Socioeconomic History  . Marital status: Married    Spouse name: Not on file  . Number of children: Not on file  . Years of education: Not on file  . Highest education level: Not on file  Occupational History  . Not on file  Social Needs  . Financial resource strain: Not on file  . Food insecurity:    Worry: Not on file    Inability: Not on file  . Transportation needs:    Medical: Not on file    Non-medical: Not on file  Tobacco Use  . Smoking status: Former Smoker      Packs/day: 1.00    Years: 36.00    Pack years: 36.00    Last attempt to quit: 07/23/2014    Years since quitting: 3.9  . Smokeless tobacco: Never Used  Substance and Sexual Activity  . Alcohol use: Yes  . Drug use: No  . Sexual activity: Not on file  Lifestyle  . Physical activity:    Days per week: Not on file    Minutes per session: Not on file  . Stress: Not on file  Relationships  . Social connections:    Talks on phone: Not on file    Gets together: Not on file    Attends religious service: Not on file    Active member of club or organization: Not on file    Attends meetings of clubs or organizations: Not on file    Relationship status: Not on file  Other Topics Concern  . Not on file  Social History Narrative  . Not on file     Family History: The patient's family history includes Cancer in his mother; Diabetes in his brother; Heart attack in his brother, father, and unknown relative; Hypertension in his father and mother. ROS:   Please see the history of present illness.     All other systems reviewed and are negative.  EKGs/Labs/Other Studies Reviewed:    The following studies were reviewed today:  Exercise Myoview 10/24/17  The left ventricular ejection fraction is hyperdynamic (>65%).  Nuclear stress EF: 69%.  There was mild flattening of the ST segments < 0,31mm during infusion in the inferolateral leads  The study is normal.  This is a low risk study.  Echocardiogram 05/12/17 Study Conclusions - Left ventricle: The cavity size was normal. There was mild   concentric hypertrophy. Systolic function was vigorous. The   estimated ejection fraction was in the range of 65% to 70%. Wall   motion was normal; there were no regional wall motion   abnormalities. Doppler parameters are consistent with abnormal   left ventricular relaxation (grade 1 diastolic dysfunction). - Mitral valve: There was mild regurgitation. - Right ventricle: The cavity size was  mildly dilated. Wall   thickness was normal. - Right atrium: The atrium was moderately dilated.  Impressions: - Compared to the prior study, there has been no significant   interval change.   EKG:  EKG is ordered today.  The ekg ordered today demonstrates NSR, 88 bpm, no significant changes.   Recent Labs: No results found for requested labs within last 8760 hours.   Recent Lipid Panel    Component Value Date/Time   CHOL 165  07/18/2015 0924   TRIG 137.0 07/18/2015 0924   HDL 42.00 07/18/2015 0924   CHOLHDL 4 07/18/2015 0924   VLDL 27.4 07/18/2015 0924   LDLCALC 96 07/18/2015 0924    Physical Exam:    VS:  BP 126/84   Pulse 88   Ht 6' (1.829 m)   Wt 224 lb 6.4 oz (101.8 kg)   SpO2 99%   BMI 30.43 kg/m     Wt Readings from Last 3 Encounters:  07/08/18 224 lb 6.4 oz (101.8 kg)  10/24/17 226 lb (102.5 kg)  10/01/17 226 lb 12.8 oz (102.9 kg)     Physical Exam  Constitutional: He is oriented to person, place, and time. He appears well-developed and well-nourished. No distress.  HENT:  Head: Normocephalic and atraumatic.  Neck: Normal range of motion. Neck supple. No JVD present.  Cardiovascular: Normal rate, regular rhythm, normal heart sounds and intact distal pulses. Exam reveals no gallop and no friction rub.  No murmur heard. Pulmonary/Chest: Effort normal and breath sounds normal. No respiratory distress. He has no wheezes. He has no rales.  Abdominal: Soft. Bowel sounds are normal.  Musculoskeletal: Normal range of motion. He exhibits no edema.  Neurological: He is alert and oriented to person, place, and time.  Skin: Skin is warm and dry.  Psychiatric: He has a normal mood and affect. His behavior is normal. Judgment and thought content normal.  Vitals reviewed.    ASSESSMENT:    1. Coronary artery disease involving native coronary artery of native heart without angina pectoris   2. Essential hypertension   3. Hyperlipidemia with target LDL less than 70     PLAN:    In order of problems listed above:  CAD: S/P MI and DES to LAD 06/2014. On Plavix, BB, statin. Normal stress test in 09/2017. Pt is quite active with no angina.   Hypertension: On amlodipine 5 mg daily, carvedilol 3.125 mg BID, HCTZ 25 mg daily, irbesartan 300 mg daily. BP well controlled. Labs followed by Dr. Felipa Eth.   Hyperlipidemia:  Unable to tolerate atorvastatin or pravastatin. Will try rosuvastatin 10 mg daily. Continue fish oil and zetia.  Recheck lipids in 4 weeks. If he does not tolerate this or LDL not to goal of <70 will initiate PCSK9-I.   Pre-op evaluation for colonoscopy: the patient is at acceptable risk for this low risk procedure and can proceed without any further cardiac workup.  OK to hold plavix for 4 days prior to procedure per Dr. Irish Lack. Resume plavix when no bleeding after procedure.   I will efax this note to [facility]  Medication Adjustments/Labs and Tests Ordered: Current medicines are reviewed at length with the patient today.  Concerns regarding medicines are outlined above. Labs and tests ordered and medication changes are outlined in the patient instructions below:  Patient Instructions  Medication Instructions: Your physician has recommended you make the following change in your medication:   START: Rosuvastatin (crestor) 10 mg daily    Labwork: Your physician recommends that you return for a FASTING lipid profile: in 4 weeks     Procedures/Testing: None   Follow-Up: Your physician wants you to follow-up in: 1 year with Dr. Glennon Hamilton will receive a reminder letter in the mail two months in advance. If you don't receive a letter, please call our office to schedule the follow-up appointment.   Any Additional Special Instructions Will Be Listed Below (If Applicable).     If you need a refill on  your cardiac medications before your next appointment, please call your pharmacy.      Signed, Daune Perch, NP  07/08/2018 5:34 PM     [facility]

## 2018-07-08 NOTE — Patient Instructions (Signed)
Medication Instructions: Your physician has recommended you make the following change in your medication:   START: Rosuvastatin (crestor) 10 mg daily    Labwork: Your physician recommends that you return for a FASTING lipid profile: in 4 weeks     Procedures/Testing: None   Follow-Up: Your physician wants you to follow-up in: 1 year with Dr. Glennon Hamilton will receive a reminder letter in the mail two months in advance. If you don't receive a letter, please call our office to schedule the follow-up appointment.   Any Additional Special Instructions Will Be Listed Below (If Applicable).     If you need a refill on your cardiac medications before your next appointment, please call your pharmacy.

## 2018-07-14 DIAGNOSIS — I2510 Atherosclerotic heart disease of native coronary artery without angina pectoris: Secondary | ICD-10-CM | POA: Diagnosis not present

## 2018-07-14 DIAGNOSIS — K6389 Other specified diseases of intestine: Secondary | ICD-10-CM | POA: Diagnosis not present

## 2018-07-14 DIAGNOSIS — D123 Benign neoplasm of transverse colon: Secondary | ICD-10-CM | POA: Diagnosis not present

## 2018-07-14 DIAGNOSIS — G8918 Other acute postprocedural pain: Secondary | ICD-10-CM | POA: Diagnosis not present

## 2018-07-14 DIAGNOSIS — R1084 Generalized abdominal pain: Secondary | ICD-10-CM | POA: Diagnosis not present

## 2018-07-14 DIAGNOSIS — D120 Benign neoplasm of cecum: Secondary | ICD-10-CM | POA: Diagnosis not present

## 2018-07-14 DIAGNOSIS — Z9889 Other specified postprocedural states: Secondary | ICD-10-CM | POA: Diagnosis not present

## 2018-07-14 DIAGNOSIS — R140 Abdominal distension (gaseous): Secondary | ICD-10-CM | POA: Diagnosis not present

## 2018-07-14 DIAGNOSIS — I10 Essential (primary) hypertension: Secondary | ICD-10-CM | POA: Diagnosis not present

## 2018-07-14 DIAGNOSIS — Z86010 Personal history of colonic polyps: Secondary | ICD-10-CM | POA: Diagnosis not present

## 2018-07-14 DIAGNOSIS — R1031 Right lower quadrant pain: Secondary | ICD-10-CM | POA: Diagnosis not present

## 2018-07-14 DIAGNOSIS — Z1211 Encounter for screening for malignant neoplasm of colon: Secondary | ICD-10-CM | POA: Diagnosis not present

## 2018-07-14 DIAGNOSIS — K449 Diaphragmatic hernia without obstruction or gangrene: Secondary | ICD-10-CM | POA: Diagnosis not present

## 2018-07-14 DIAGNOSIS — K7689 Other specified diseases of liver: Secondary | ICD-10-CM | POA: Diagnosis not present

## 2018-07-28 DIAGNOSIS — G4733 Obstructive sleep apnea (adult) (pediatric): Secondary | ICD-10-CM | POA: Diagnosis not present

## 2018-07-30 DIAGNOSIS — R197 Diarrhea, unspecified: Secondary | ICD-10-CM | POA: Diagnosis not present

## 2018-07-30 DIAGNOSIS — Z86010 Personal history of colonic polyps: Secondary | ICD-10-CM | POA: Diagnosis not present

## 2018-07-30 DIAGNOSIS — D72829 Elevated white blood cell count, unspecified: Secondary | ICD-10-CM | POA: Diagnosis not present

## 2018-08-05 ENCOUNTER — Other Ambulatory Visit: Payer: 59

## 2018-08-05 DIAGNOSIS — E785 Hyperlipidemia, unspecified: Secondary | ICD-10-CM | POA: Diagnosis not present

## 2018-08-05 LAB — LIPID PANEL
CHOLESTEROL TOTAL: 104 mg/dL (ref 100–199)
Chol/HDL Ratio: 2.4 ratio (ref 0.0–5.0)
HDL: 44 mg/dL (ref >39)
LDL Calculated: 46 mg/dL (ref 0–99)
TRIGLYCERIDES: 68 mg/dL (ref 0–149)
VLDL Cholesterol Cal: 14 mg/dL (ref 5–40)

## 2018-08-06 DIAGNOSIS — L089 Local infection of the skin and subcutaneous tissue, unspecified: Secondary | ICD-10-CM | POA: Diagnosis not present

## 2018-08-27 DIAGNOSIS — G4733 Obstructive sleep apnea (adult) (pediatric): Secondary | ICD-10-CM | POA: Diagnosis not present

## 2018-08-27 DIAGNOSIS — E7800 Pure hypercholesterolemia, unspecified: Secondary | ICD-10-CM | POA: Diagnosis not present

## 2018-08-27 DIAGNOSIS — Z125 Encounter for screening for malignant neoplasm of prostate: Secondary | ICD-10-CM | POA: Diagnosis not present

## 2018-08-27 DIAGNOSIS — I10 Essential (primary) hypertension: Secondary | ICD-10-CM | POA: Diagnosis not present

## 2018-08-27 DIAGNOSIS — Z0000 Encounter for general adult medical examination without abnormal findings: Secondary | ICD-10-CM | POA: Diagnosis not present

## 2018-08-27 DIAGNOSIS — E1169 Type 2 diabetes mellitus with other specified complication: Secondary | ICD-10-CM | POA: Diagnosis not present

## 2018-08-27 DIAGNOSIS — Z79899 Other long term (current) drug therapy: Secondary | ICD-10-CM | POA: Diagnosis not present

## 2018-08-27 DIAGNOSIS — Z23 Encounter for immunization: Secondary | ICD-10-CM | POA: Diagnosis not present

## 2018-09-27 DIAGNOSIS — G4733 Obstructive sleep apnea (adult) (pediatric): Secondary | ICD-10-CM | POA: Diagnosis not present

## 2018-10-18 ENCOUNTER — Other Ambulatory Visit: Payer: Self-pay | Admitting: Interventional Cardiology

## 2019-02-09 DIAGNOSIS — K047 Periapical abscess without sinus: Secondary | ICD-10-CM | POA: Diagnosis not present

## 2019-02-23 DIAGNOSIS — G4733 Obstructive sleep apnea (adult) (pediatric): Secondary | ICD-10-CM | POA: Diagnosis not present

## 2019-03-02 DIAGNOSIS — Z79899 Other long term (current) drug therapy: Secondary | ICD-10-CM | POA: Diagnosis not present

## 2019-03-02 DIAGNOSIS — I10 Essential (primary) hypertension: Secondary | ICD-10-CM | POA: Diagnosis not present

## 2019-03-02 DIAGNOSIS — E1169 Type 2 diabetes mellitus with other specified complication: Secondary | ICD-10-CM | POA: Diagnosis not present

## 2019-03-09 DIAGNOSIS — E1169 Type 2 diabetes mellitus with other specified complication: Secondary | ICD-10-CM | POA: Diagnosis not present

## 2019-03-09 DIAGNOSIS — Z79899 Other long term (current) drug therapy: Secondary | ICD-10-CM | POA: Diagnosis not present

## 2019-07-30 ENCOUNTER — Other Ambulatory Visit: Payer: Self-pay | Admitting: Interventional Cardiology

## 2019-08-29 ENCOUNTER — Other Ambulatory Visit: Payer: Self-pay | Admitting: Interventional Cardiology

## 2020-09-05 ENCOUNTER — Ambulatory Visit: Payer: Self-pay | Admitting: Surgery

## 2020-09-05 NOTE — H&P (Signed)
Donald Torres Appointment: 09/05/2020 9:30 AM Location: [facility] Patient #: [id number] DOB: 1961-06-09 Married / Language: Donald Torres / Race: White Male   History of Present Illness Donald Torres; 09/05/2020 1:15 PM) The patient is a 59 year old male who presents with inguinal pain. Note for "Inguinal pain": ` ` ` Patient sent for surgical consultation at the request of Donald Torres, Donald Torres  Chief Complaint: Groin swelling/pain ? recurrent hernia? ` ` The patient is a gentleman found to have bilateral inguinal hernias. Had laparoscopic repair by Donald Torres with our group in 2012. TAPP approach Physio-mesh 14 x 10 cm placed on the right side. Another sheet of physio-mesh placed on the left side. Looks like he thought these were direct space hernias of moderate size. No major issues but then started having some intermittent groin pain this past year. Worse with activity. He used to do heavy lifting with his job that has backed off of that. 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 Left groin greater than right side. Used to smoke but quit about 5 years ago. No problems with urination or defecation. Moves his bowels usually 1 time a day. Can walk at least a half hour without difficulty. No diabetes. Does have sleep apnea. He is on Plavix blood thinners for a myocardial infarction treated with a stent by Donald Torres. No angina since.  No personal nor family history of GI/colon cancer, inflammatory bowel disease, irritable bowel syndrome, allergy such as Celiac Sprue, dietary/dairy problems, colitis, ulcers nor gastritis. No recent sick contacts/gastroenteritis. No travel outside the country. No changes in diet. No dysphagia to solids or liquids. No significant heartburn or reflux. No melena, hematemesis, coffee ground emesis. No evidence of prior gastric/peptic ulceration.  (Review of systems as stated in this  history (HPI) or in the review of systems. Otherwise all other 12 point ROS are negative) ` ` ###########################################`  This patient encounter took 35 minutes today to perform the following: obtain history, perform exam, review outside records, interpret tests & imaging, counsel the patient on their diagnosis; and, document this encounter, including findings & plan in the electronic health record (EHR).   Past Surgical History Donald Torres, Donald Torres; 09/05/2020 9:20 AM) Colon Polyp Removal - Colonoscopy   Diagnostic Studies History Donald Torres, Donald Torres; 09/05/2020 9:20 AM) Colonoscopy  1-5 years ago  Allergies Donald Torres, Torres; 09/05/2020 9:24 AM) Flagyl *ANTI-INFECTIVE AGENTS - MISC.*  Allergies Reconciled   Medication History Donald Torres, Donald Torres; 09/05/2020 9:24 AM) amLODIPine Besylate (5MG  Tablet, Oral) Active. Carvedilol (3.125MG  Tablet, Oral) Active. Plavix (75MG  Tablet, Oral) Active. hydroCHLOROthiazide (25MG  Tablet, Oral) Active. Irbesartan (300MG  Tablet, Oral) Active. Nitroglycerin (0.4MG  Tab Sublingual, Sublingual) Active. Crestor (10MG  Tablet, Oral) Active. Dapsone (100MG  Tablet, Oral) Active. Zetia (10MG  Tablet, Oral) Active. Medications Reconciled  Social History Donald Torres, Donald Torres; 09/05/2020 9:20 AM) Alcohol use  Occasional alcohol use. Caffeine use  Coffee. Tobacco use  Former smoker.  Family History Donald Torres, Donald Torres; 09/05/2020 9:20 AM) Alcohol Abuse  Brother. Breast Cancer  Mother. Diabetes Mellitus  Brother. Heart Disease  Father. Heart disease in male family member before age 35  Hypertension  Father, Mother. Prostate Cancer  Father.  Other Problems Donald Torres, Donald Torres; 09/05/2020 9:20 AM) Anxiety Disorder  Back Pain  Chest pain  Gastroesophageal Reflux Disease  High blood pressure  Hypercholesterolemia  Inguinal Hernia  Myocardial infarction  Sleep Apnea     Review of Systems Donald Torres  Torres; 09/05/2020 9:20  AM) General Not Present- Appetite Loss, Chills, Fatigue, Fever, Night Sweats, Weight Gain and Weight Loss. Skin Not Present- Change in Wart/Mole, Dryness, Hives, Jaundice, New Lesions, Non-Healing Wounds, Rash and Ulcer. HEENT Present- Wears glasses/contact lenses. Not Present- Earache, Hearing Loss, Hoarseness, Nose Bleed, Oral Ulcers, Ringing in the Ears, Seasonal Allergies, Sinus Pain, Sore Throat, Visual Disturbances and Yellow Eyes. Respiratory Present- Snoring. Not Present- Bloody sputum, Chronic Cough, Difficulty Breathing and Wheezing. Breast Not Present- Breast Mass, Breast Pain, Nipple Discharge and Skin Changes. Cardiovascular Not Present- Chest Pain, Difficulty Breathing Lying Down, Leg Cramps, Palpitations, Rapid Heart Rate, Shortness of Breath and Swelling of Extremities. Gastrointestinal Not Present- Abdominal Pain, Bloating, Bloody Stool, Change in Bowel Habits, Chronic diarrhea, Constipation, Difficulty Swallowing, Excessive gas, Gets full quickly at meals, Hemorrhoids, Indigestion, Nausea, Rectal Pain and Vomiting. Male Genitourinary Not Present- Blood in Urine, Change in Urinary Stream, Frequency, Impotence, Nocturia, Painful Urination, Urgency and Urine Leakage.  Vitals Donald Torres; 09/05/2020 9:24 AM) 09/05/2020 9:24 AM Weight: 225.25 lb Height: 70in Body Surface Area: 2.2 m Body Mass Index: 32.32 kg/m  Temp.: 98.24F  Pulse: 93 (Regular)  P.OX: 96% (Room air) BP: 134/74(Sitting, Left Arm, Standard)       Physical Exam Donald Torres; 09/05/2020 9:53 AM) General Mental Status-Alert. General Appearance-Not in acute distress, Not Sickly. Orientation-Oriented X3. Hydration-Well hydrated. Voice-Normal.  Integumentary Global Assessment Upon inspection and palpation of skin surfaces of the - Axillae: non-tender, no inflammation or ulceration, no drainage. and Distribution of scalp and body hair is normal. General  Characteristics Temperature - normal warmth is noted.  Head and Neck Head-normocephalic, atraumatic with no lesions or palpable masses. Face Global Assessment - atraumatic, no absence of expression. Neck Global Assessment - no abnormal movements, no bruit auscultated on the right, no bruit auscultated on the left, no decreased range of motion, non-tender. Trachea-midline. Thyroid Gland Characteristics - non-tender.  Eye Eyeball - Left-Extraocular movements intact, No Nystagmus - Left. Eyeball - Right-Extraocular movements intact, No Nystagmus - Right. Cornea - Left-No Hazy - Left. Cornea - Right-No Hazy - Right. Sclera/Conjunctiva - Left-No scleral icterus, No Discharge - Left. Sclera/Conjunctiva - Right-No scleral icterus, No Discharge - Right. Pupil - Left-Direct reaction to light normal. Pupil - Right-Direct reaction to light normal.  ENMT Ears Pinna - Left - no drainage observed, no generalized tenderness observed. Pinna - Right - no drainage observed, no generalized tenderness observed. Nose and Sinuses External Inspection of the Nose - no destructive lesion observed. Inspection of the nares - Left - quiet respiration. Inspection of the nares - Right - quiet respiration. Mouth and Throat Lips - Upper Lip - no fissures observed, no pallor noted. Lower Lip - no fissures observed, no pallor noted. Nasopharynx - no discharge present. Oral Cavity/Oropharynx - Tongue - no dryness observed. Oral Mucosa - no cyanosis observed. Hypopharynx - no evidence of airway distress observed.  Chest and Lung Exam Inspection Movements - Normal and Symmetrical. Accessory muscles - No use of accessory muscles in breathing. Palpation Palpation of the chest reveals - Non-tender. Auscultation Breath sounds - Normal and Clear.  Cardiovascular Auscultation Rhythm - Regular. Murmurs & Other Heart Sounds - Auscultation of the heart reveals - No Murmurs and No Systolic  Clicks.  Abdomen Inspection Inspection of the abdomen reveals - No Visible peristalsis and No Abnormal pulsations. Umbilicus - No Bleeding, No Urine drainage. Palpation/Percussion Palpation and Percussion of the abdomen reveal - Soft, Non Tender, No Rebound tenderness, No Rigidity (guarding) and No Cutaneous  hyperesthesia. Note: Abdomen soft. Obese with moderate suprapubic umbilical diastases recti. The down umbilicus but no definite hernia. Nontender. Not distended. No umbilical or incisional hernias. No guarding.   Male Genitourinary Sexual Maturity Tanner 5 - Adult hair pattern and Adult penile size and shape. Note: Normal external male genitalia. No distasteful or epididymal masses. Circumcised.  Low groin bulges on both sides. Seem like femoral hernias. Sensitive. No definite hernia at direct space nor inguinal ring.   Peripheral Vascular Upper Extremity Inspection - Left - No Cyanotic nailbeds - Left, Not Ischemic. Inspection - Right - No Cyanotic nailbeds - Right, Not Ischemic.  Neurologic Neurologic evaluation reveals -normal attention span and ability to concentrate, able to name objects and repeat phrases. Appropriate fund of knowledge , normal sensation and normal coordination. Mental Status Affect - not angry, not paranoid. Cranial Nerves-Normal Bilaterally. Gait-Normal.  Neuropsychiatric Mental status exam performed with findings of-able to articulate well with normal speech/language, rate, volume and coherence, thought content normal with ability to perform basic computations and apply abstract reasoning and no evidence of hallucinations, delusions, obsessions or homicidal/suicidal ideation.  Musculoskeletal Global Assessment Spine, Ribs and Pelvis - no instability, subluxation or laxity. Right Upper Extremity - no instability, subluxation or laxity.  Lymphatic Head & Neck  General Head & Neck Lymphatics: Bilateral - Description - No Localized  lymphadenopathy. Axillary  General Axillary Region: Bilateral - Description - No Localized lymphadenopathy. Femoral & Inguinal  Generalized Femoral & Inguinal Lymphatics: Left - Description - No Localized lymphadenopathy. Right - Description - No Localized lymphadenopathy.    Assessment & Plan Donald Torres; 09/05/2020 1:17 PM) BILATERAL FEMORAL HERNIA WITHOUT OBSTRUCTION OR GANGRENE, RECURRENCE NOT SPECIFIED (K41.20) Impression: Recurrent groin hernias. The seem rather low suspicious for femoral hernias developing inferior to prior mesh repair. I think he would benefit from hernia repair. We'll try and do a laparoscopic preperitoneal approach. It may be that we'll have to do a combination with open given the prior preperitoneal mesh. We'll have to see what we find. Can be a challenge to try and fix femoral h hernias via an open incision.  He wishes to be aggressive and proceed with surgery. Patient is at increased risk of chronic nerve pain with redo surgery but we will see. I did note he can go back to see Donald Torres or deep venous his surgeon. He felt comfortable with me proceeding with surgery. PREOP - ING HERNIA - ENCOUNTER FOR PREOPERATIVE EXAMINATION FOR GENERAL SURGICAL PROCEDURE (Z01.818) Current Plans You are being scheduled for surgery- Our schedulers will call you.  You should hear from our office's scheduling department within 5 working days about the location, date, and time of surgery. We try to make accommodations for patient's preferences in scheduling surgery, but sometimes the OR schedule or the surgeon's schedule prevents [place] from making those accommodations.  If you have not heard from our office [phone]) in 5 working days, call the office and ask for your surgeon's nurse.  If you have other questions about your diagnosis, plan, or surgery, call the office and ask for your surgeon's nurse.  Written instructions provided The anatomy & physiology of the  abdominal wall and pelvic floor was discussed. The pathophysiology of hernias in the inguinal and pelvic region was discussed. Natural history risks such as progressive enlargement, pain, incarceration, and strangulation was discussed. Contributors to complications such as smoking, obesity, diabetes, prior surgery, etc were discussed.  I feel the risks of no intervention will lead to serious problems  that outweigh the operative risks; therefore, I recommended surgery to reduce and repair the hernia. I explained laparoscopic techniques with possible need for an open approach. I noted usual use of mesh to patch and/or buttress hernia repair  Risks such as bleeding, infection, abscess, need for further treatment, heart attack, death, and other risks were discussed. I noted a good likelihood this will help address the problem. Goals of post-operative recovery were discussed as well. Possibility that this will not correct all symptoms was explained. I stressed the importance of low-impact activity, aggressive pain control, avoiding constipation, & not pushing through pain to minimize risk of post-operative chronic pain or injury. Possibility of reherniation was discussed. We will work to minimize complications.  An educational handout further explaining the pathology & treatment options was given as well. Questions were answered. The patient expresses understanding & wishes to proceed with surgery.  Pt Education - Pamphlet Given - Laparoscopic Hernia Repair: discussed with patient and provided information. Pt Education - [facility] Pain Control (Shacora Zynda) Pt Education - [facility] Hernia Post-Op HCI (Knoxx Boeding): discussed with patient and provided information. Pt Education - [facility] Mesh education: discussed with patient and provided information. ANTICOAGULATED (Z79.01) Impression: Anticoagulant Plavix. More than a year out from stenting. Most likely okay to come off Plavix for 5 days preoperatively and then resume  Plavix postoperative day 1-2 depending on operative findings and recovery. Defer to his cardiologist to make sure that Dr. 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 He is overdue see his cardiologist. He had a normal Heart stress test study in 2018 for the need for a different procedure. Current Plans I recommended obtaining preoperative cardiac clearance. I am concerned about the health of the patient and the ability to tolerate the operation. Therefore, we will request clearance by cardiology to better assess operative risk & see if a reevaluation, further workup, etc is needed. Also recommendations on how medications such as for anticoagulation and blood pressure should be managed/held/restarted after surgery.   Signed electronically by Donald Hector, Torres (09/05/2020 1:17 PM)  Donald Hector, Torres, FACS, MASCRS Gastrointestinal and Minimally Invasive Surgery  [facility] [address], [place], [place] [address] [phone] Fax [phone] Main/Paging  CONTACT INFORMATION: Weekday (9AM-5PM) concerns: Call [facility] main office at [phone] Weeknight (5PM-9AM) or Weekend/Holiday concerns: Check [email or web address] for General Surgery [facility] coverage (Please, do not use SecureChat as it is not reliable communication to operating surgeons for immediate patient care)

## 2020-09-11 NOTE — Progress Notes (Signed)
Cardiology Office Note   Date:  09/12/2020   ID:  [name], DOB 1961-06-20, MRN [id number]  PCP:  Donald Smoker, MD    No chief complaint on file.  CAD  Wt Readings from Last 3 Encounters:  09/12/20 226 lb 3.2 oz (102.6 kg)  07/08/18 224 lb 6.4 oz (101.8 kg)  10/24/17 226 lb (102.5 kg)       History of Present Illness: Donald Torres is a 59 y.o. male  with a history of CAD, HTN and tobacco abuse. He was admitted 06/2014 with a non-STEMI. LHC demonstrated a 90% lesion in the LAD with mobile thrombus that was treated with aspiration thrombectomy and a DES. EF was normal.  Angina was a bilateral arm fatigue.   His wife, Margarita Grizzle works at Dr. Carlyle Lipa office.  He had leg pains that were attributed to lipitor.  He continued to have pains in his legs. The cholesterol had gone up so he went back on pravastatin.  He has done well for several years since the stent in 2015.  He was hospitalized in 2018, July with a dental infection and abscess.  Since the last visit, he has had bilateral femoral hernias that now require repair.   Denies : Chest pain. Dizziness. Leg edema. Nitroglycerin use. Orthopnea. Palpitations. Paroxysmal nocturnal dyspnea. Shortness of breath. Syncope.   Walking has been limited by the hernias. 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  No sx like his prior angina.       Past Medical History:  Diagnosis Date  . CAD (coronary artery disease)    NSTEMI 06/2014 >>> LHC (9/15):  Mid LAD 90% with mobile thrombus, EF 60% >> PCI:  Aspiration thrombectomy + 2.75 x 28 Promus DES to mid LAD  . High cholesterol   . Hx of echocardiogram    Echo (9/15):  EF 55-60%, no RWMA, Gr 1 DD, mild BAE  . Hypertension     Past Surgical History:  Procedure Laterality Date  . HERNIA REPAIR    . LEFT HEART CATHETERIZATION WITH CORONARY ANGIOGRAM N/A 07/26/2014   Procedure: LEFT HEART CATHETERIZATION WITH CORONARY  ANGIOGRAM;  Surgeon: Donald Booze, MD;  Location: [facility] CATH LAB;  Service: Cardiovascular;  Laterality: N/A;     Current Outpatient Medications  Medication Sig Dispense Refill  . amLODipine (NORVASC) 5 MG tablet Take 5 mg by mouth daily.    . carvedilol (COREG) 3.125 MG tablet TAKE 1 TABLET BY MOUTH TWO TIMES DAILY 180 tablet 2  . clopidogrel (PLAVIX) 75 MG tablet Take 1 tablet (75 mg total) by mouth daily. Please make overdue appt with Dr. Irish Lack before anymore refills. 2nd attempt 15 tablet 0  . dapsone 100 MG tablet Take 100 mg by mouth daily.    Marland Kitchen ezetimibe (ZETIA) 10 MG tablet Take 10 mg by mouth daily.    . hydrochlorothiazide (HYDRODIURIL) 25 MG tablet Take 1 tablet (25 mg total) by mouth daily. Please make overdue appt with Dr. Irish Lack before anymore refills. 2nd attempt 15 tablet 0  . nitroGLYCERIN (NITROSTAT) 0.4 MG SL tablet Place 1 tablet (0.4 mg total) under the tongue every 5 (five) minutes x 3 doses as needed. For chest pain 25 tablet 3  . Omega-3 Fatty Acids (FISH OIL PO) Take 1,000 mg by mouth daily.     . rosuvastatin (CRESTOR) 10 MG tablet Take 1 tablet (10 mg total) by mouth daily. 90 tablet  3  . telmisartan (MICARDIS) 40 MG tablet Take 40 mg by mouth daily.     No current facility-administered medications for this visit.    Allergies:   Ciprofloxacin and Flagyl [metronidazole]    Social History:  The patient  reports that he quit smoking about 6 years ago. He has a 36.00 pack-year smoking history. He has never used smokeless tobacco. He reports current alcohol use. He reports that he does not use drugs.   Family History:  The patient's family history includes Cancer in his mother; Diabetes in his brother; Heart attack in his brother, father, and unknown relative; Hypertension in his father and mother.    ROS:  Please see the history of present illness.   Otherwise, review of systems are positive for groin pain.   All other systems are reviewed and negative.     PHYSICAL EXAM: VS:  BP (!) 168/88   Pulse 81   Ht 6' (1.829 m)   Wt 226 lb 3.2 oz (102.6 kg)   SpO2 97%   BMI 30.68 kg/m  , BMI Body mass index is 30.68 kg/m. GEN: Well nourished, well developed, in no acute distress  HEENT: normal  Neck: no JVD, carotid bruits, or masses Cardiac: RRR; no murmurs, rubs, or gallops,no edema  Respiratory:  clear to auscultation bilaterally, normal work of breathing GI: soft, nontender, nondistended, + BS MS: no deformity or atrophy  Skin: warm and dry, no rash Neuro:  Strength and sensation are intact Psych: euthymic mood, full affect   EKG:   The ekg ordered today demonstrates NSR, no ST changes   Recent Labs: No results found for requested labs within last 8760 hours.   Lipid Panel    Component Value Date/Time   CHOL 104 08/05/2018 0801   TRIG 68 08/05/2018 0801   HDL 44 08/05/2018 0801   CHOLHDL 2.4 08/05/2018 0801   CHOLHDL 4 07/18/2015 0924   VLDL 27.4 07/18/2015 0924   LDLCALC 46 08/05/2018 0801     Other studies Reviewed: Additional studies/ records that were reviewed today with results demonstrating: LDL 92 in 2020. .   ASSESSMENT AND PLAN:  1. CAD/Old MI: No angina. Continue aggressive secondary prevention.   2. Hyperlipidemia: LDL 92 in 2020. Continue rosuvastatin. Healthy diet recommended. 3. HTN: At home, BP in the 134/74 range.  Irbesartan on recall, so this was switched to telmisartan.  If BP readings are increased, could increase Coreg to 6.25 mg BID, Increase amlodipine to 10 mg daily or increase telmisartan to 80 mg daily.  4. Preoperative eval: No further cardiac testing needed before surgery. Perioperative risk of cardiac event in the 3% range.  Ok to hold Plavix for 5 days prior to surgery.     Current medicines are reviewed at length with the patient today.  The patient concerns regarding his medicines were addressed.  The following changes have been made:  No change  Labs/ tests ordered today include:   No orders of the defined types were placed in this encounter.   Recommend 150 minutes/week of aerobic exercise Low fat, low carb, high fiber diet recommended  Disposition:   FU in 1 year   Signed, Larae Grooms, MD  09/12/2020 4:13 PM    [facility] [place], [place], [place]  [address] Phone: [phone]; Fax: [phone]

## 2020-09-12 ENCOUNTER — Ambulatory Visit (INDEPENDENT_AMBULATORY_CARE_PROVIDER_SITE_OTHER): Payer: 59 | Admitting: Interventional Cardiology

## 2020-09-12 ENCOUNTER — Encounter: Payer: Self-pay | Admitting: Interventional Cardiology

## 2020-09-12 ENCOUNTER — Other Ambulatory Visit: Payer: Self-pay

## 2020-09-12 VITALS — BP 168/88 | HR 81 | Ht 72.0 in | Wt 226.2 lb

## 2020-09-12 DIAGNOSIS — E782 Mixed hyperlipidemia: Secondary | ICD-10-CM

## 2020-09-12 DIAGNOSIS — Z01810 Encounter for preprocedural cardiovascular examination: Secondary | ICD-10-CM | POA: Diagnosis not present

## 2020-09-12 DIAGNOSIS — I25118 Atherosclerotic heart disease of native coronary artery with other forms of angina pectoris: Secondary | ICD-10-CM | POA: Diagnosis not present

## 2020-09-12 DIAGNOSIS — I10 Essential (primary) hypertension: Secondary | ICD-10-CM

## 2020-09-12 NOTE — Patient Instructions (Signed)

## 2020-09-13 NOTE — Addendum Note (Signed)
Addended by: Carylon Perches on: 09/13/2020 01:31 PM   Modules accepted: Orders

## 2021-12-27 ENCOUNTER — Encounter: Payer: Self-pay | Admitting: Interventional Cardiology

## 2021-12-27 MED ORDER — NITROGLYCERIN 0.4 MG SL SUBL: 0.4000 mg | SUBLINGUAL_TABLET | SUBLINGUAL | 0 refills | Status: AC | PRN

## 2022-01-04 ENCOUNTER — Encounter (HOSPITAL_BASED_OUTPATIENT_CLINIC_OR_DEPARTMENT_OTHER): Payer: Self-pay

## 2022-01-04 ENCOUNTER — Emergency Department (HOSPITAL_BASED_OUTPATIENT_CLINIC_OR_DEPARTMENT_OTHER): Payer: 59

## 2022-01-04 ENCOUNTER — Observation Stay (HOSPITAL_BASED_OUTPATIENT_CLINIC_OR_DEPARTMENT_OTHER)
Admission: EM | Admit: 2022-01-04 | Discharge: 2022-01-05 | Disposition: A | Discharge status: Home / Self Care | Payer: 59 | Attending: Emergency Medicine | Admitting: Emergency Medicine

## 2022-01-04 ENCOUNTER — Other Ambulatory Visit: Payer: Self-pay

## 2022-01-04 DIAGNOSIS — I200 Unstable angina: Secondary | ICD-10-CM | POA: Diagnosis not present

## 2022-01-04 DIAGNOSIS — Z79899 Other long term (current) drug therapy: Secondary | ICD-10-CM | POA: Diagnosis not present

## 2022-01-04 DIAGNOSIS — R079 Chest pain, unspecified: Principal | ICD-10-CM | POA: Diagnosis present

## 2022-01-04 DIAGNOSIS — R55 Syncope and collapse: Secondary | ICD-10-CM | POA: Diagnosis present

## 2022-01-04 DIAGNOSIS — E079 Disorder of thyroid, unspecified: Secondary | ICD-10-CM | POA: Diagnosis not present

## 2022-01-04 DIAGNOSIS — Z8249 Family history of ischemic heart disease and other diseases of the circulatory system: Secondary | ICD-10-CM

## 2022-01-04 DIAGNOSIS — I25110 Atherosclerotic heart disease of native coronary artery with unstable angina pectoris: Principal | ICD-10-CM | POA: Insufficient documentation

## 2022-01-04 DIAGNOSIS — I252 Old myocardial infarction: Secondary | ICD-10-CM

## 2022-01-04 DIAGNOSIS — Z7902 Long term (current) use of antithrombotics/antiplatelets: Secondary | ICD-10-CM | POA: Diagnosis not present

## 2022-01-04 DIAGNOSIS — I10 Essential (primary) hypertension: Secondary | ICD-10-CM | POA: Diagnosis not present

## 2022-01-04 DIAGNOSIS — Z833 Family history of diabetes mellitus: Secondary | ICD-10-CM

## 2022-01-04 DIAGNOSIS — I951 Orthostatic hypotension: Secondary | ICD-10-CM | POA: Diagnosis present

## 2022-01-04 DIAGNOSIS — R739 Hyperglycemia, unspecified: Secondary | ICD-10-CM | POA: Diagnosis present

## 2022-01-04 DIAGNOSIS — E7800 Pure hypercholesterolemia, unspecified: Secondary | ICD-10-CM | POA: Diagnosis present

## 2022-01-04 DIAGNOSIS — Z955 Presence of coronary angioplasty implant and graft: Secondary | ICD-10-CM

## 2022-01-04 DIAGNOSIS — Z20822 Contact with and (suspected) exposure to covid-19: Secondary | ICD-10-CM | POA: Insufficient documentation

## 2022-01-04 DIAGNOSIS — E876 Hypokalemia: Secondary | ICD-10-CM | POA: Diagnosis present

## 2022-01-04 DIAGNOSIS — E041 Nontoxic single thyroid nodule: Secondary | ICD-10-CM | POA: Diagnosis present

## 2022-01-04 DIAGNOSIS — Z87891 Personal history of nicotine dependence: Secondary | ICD-10-CM

## 2022-01-04 DIAGNOSIS — Z809 Family history of malignant neoplasm, unspecified: Secondary | ICD-10-CM

## 2022-01-04 LAB — CBC
HCT: 38.9 % — ABNORMAL LOW (ref 39.0–52.0)
Hemoglobin: 12.7 g/dL — ABNORMAL LOW (ref 13.0–17.0)
MCH: 30.8 pg (ref 26.0–34.0)
MCHC: 32.6 g/dL (ref 30.0–36.0)
MCV: 94.2 fL (ref 80.0–100.0)
Platelets: 201 10*3/uL (ref 150–400)
RBC: 4.13 MIL/uL — ABNORMAL LOW (ref 4.22–5.81)
RDW: 14.8 % (ref 11.5–15.5)
WBC: 6.6 10*3/uL (ref 4.0–10.5)
nRBC: 0 % (ref 0.0–0.2)

## 2022-01-04 LAB — CBC WITH DIFFERENTIAL/PLATELET
Abs Immature Granulocytes: 0.02 10*3/uL (ref 0.00–0.07)
Basophils Absolute: 0.1 10*3/uL (ref 0.0–0.1)
Basophils Relative: 1 %
Eosinophils Absolute: 0.2 10*3/uL (ref 0.0–0.5)
Eosinophils Relative: 3 %
HCT: 39.2 % (ref 39.0–52.0)
Hemoglobin: 12.8 g/dL — ABNORMAL LOW (ref 13.0–17.0)
Immature Granulocytes: 0 %
Lymphocytes Relative: 20 %
Lymphs Abs: 1.2 10*3/uL (ref 0.7–4.0)
MCH: 30.9 pg (ref 26.0–34.0)
MCHC: 32.7 g/dL (ref 30.0–36.0)
MCV: 94.7 fL (ref 80.0–100.0)
Monocytes Absolute: 0.7 10*3/uL (ref 0.1–1.0)
Monocytes Relative: 12 %
Neutro Abs: 3.7 10*3/uL (ref 1.7–7.7)
Neutrophils Relative %: 64 %
Platelets: 220 10*3/uL (ref 150–400)
RBC: 4.14 MIL/uL — ABNORMAL LOW (ref 4.22–5.81)
RDW: 15.1 % (ref 11.5–15.5)
WBC: 5.7 10*3/uL (ref 4.0–10.5)
nRBC: 0 % (ref 0.0–0.2)

## 2022-01-04 LAB — TROPONIN I (HIGH SENSITIVITY)
Troponin I (High Sensitivity): 5 ng/L (ref <18)
Troponin I (High Sensitivity): 4 ng/L (ref <18)

## 2022-01-04 LAB — BASIC METABOLIC PANEL
Anion gap: 9 (ref 5–15)
BUN: 13 mg/dL (ref 6–20)
CO2: 28 mmol/L (ref 22–32)
Calcium: 9.2 mg/dL (ref 8.9–10.3)
Chloride: 102 mmol/L (ref 98–111)
Creatinine, Ser: 0.84 mg/dL (ref 0.61–1.24)
GFR, Estimated: >60 mL/min (ref >60)
Glucose, Bld: 112 mg/dL — ABNORMAL HIGH (ref 70–99)
Potassium: 3.2 mmol/L — ABNORMAL LOW (ref 3.5–5.1)
Sodium: 139 mmol/L (ref 135–145)

## 2022-01-04 LAB — BRAIN NATRIURETIC PEPTIDE: B Natriuretic Peptide: 38.2 pg/mL (ref 0.0–100.0)

## 2022-01-04 LAB — RESP PANEL BY RT-PCR (FLU A&B, COVID) ARPGX2
Influenza A by PCR: NEGATIVE
Influenza B by PCR: NEGATIVE
SARS Coronavirus 2 by RT PCR: NEGATIVE

## 2022-01-04 LAB — MRSA NEXT GEN BY PCR, NASAL: MRSA by PCR Next Gen: NOT DETECTED

## 2022-01-04 LAB — CREATININE, SERUM
Creatinine, Ser: 0.92 mg/dL (ref 0.61–1.24)
GFR, Estimated: >60 mL/min (ref >60)

## 2022-01-04 MED ORDER — CLOPIDOGREL BISULFATE 75 MG PO TABS
75.0000 mg | ORAL_TABLET | Freq: Every day | ORAL | Status: DC
  Administered 2022-01-05: 75 mg via ORAL
  Filled 2022-01-04: qty 1

## 2022-01-04 MED ORDER — ROSUVASTATIN CALCIUM 5 MG PO TABS
10.0000 mg | ORAL_TABLET | Freq: Every day | ORAL | Status: DC
  Administered 2022-01-05: 10 mg via ORAL
  Filled 2022-01-04: qty 2

## 2022-01-04 MED ORDER — CARVEDILOL 3.125 MG PO TABS
3.1250 mg | ORAL_TABLET | Freq: Two times a day (BID) | ORAL | Status: DC
  Administered 2022-01-04 – 2022-01-05 (×2): 3.125 mg via ORAL
  Filled 2022-01-04 (×2): qty 1

## 2022-01-04 MED ORDER — ACETAMINOPHEN 325 MG PO TABS: 650.0000 mg | ORAL_TABLET | ORAL | Status: DC | PRN

## 2022-01-04 MED ORDER — POTASSIUM CHLORIDE CRYS ER 20 MEQ PO TBCR
20.0000 meq | EXTENDED_RELEASE_TABLET | Freq: Once | ORAL | Status: AC
Start: 2022-01-04 — End: 2022-01-04
  Administered 2022-01-04: 14:00:00 20 meq via ORAL
  Filled 2022-01-04: qty 1

## 2022-01-04 MED ORDER — ONDANSETRON HCL 4 MG/2ML IJ SOLN: 4.0000 mg | Freq: Four times a day (QID) | INTRAMUSCULAR | Status: DC | PRN

## 2022-01-04 MED ORDER — AMLODIPINE BESYLATE 5 MG PO TABS: 5.0000 mg | ORAL_TABLET | Freq: Every day | ORAL | Status: DC

## 2022-01-04 MED ORDER — NITROGLYCERIN 0.4 MG SL SUBL: 0.4000 mg | SUBLINGUAL_TABLET | SUBLINGUAL | Status: DC | PRN

## 2022-01-04 MED ORDER — IRBESARTAN 150 MG PO TABS
150.0000 mg | ORAL_TABLET | Freq: Every day | ORAL | Status: DC
  Administered 2022-01-05: 150 mg via ORAL
  Filled 2022-01-04: qty 1

## 2022-01-04 MED ORDER — ASPIRIN EC 81 MG PO TBEC
81.0000 mg | DELAYED_RELEASE_TABLET | Freq: Every day | ORAL | Status: DC
  Administered 2022-01-05: 81 mg via ORAL

## 2022-01-04 MED ORDER — HEPARIN SODIUM (PORCINE) 5000 UNIT/ML IJ SOLN
5000.0000 [IU] | Freq: Three times a day (TID) | INTRAMUSCULAR | Status: DC
  Administered 2022-01-04 – 2022-01-05 (×2): 5000 [IU] via SUBCUTANEOUS
  Filled 2022-01-04 (×2): qty 1

## 2022-01-04 MED ORDER — EZETIMIBE 10 MG PO TABS
10.0000 mg | ORAL_TABLET | Freq: Every day | ORAL | Status: DC
  Administered 2022-01-05: 10 mg via ORAL
  Filled 2022-01-04: qty 1

## 2022-01-04 MED ORDER — SODIUM CHLORIDE 0.9% FLUSH
3.0000 mL | Freq: Two times a day (BID) | INTRAVENOUS | Status: DC
  Administered 2022-01-04 – 2022-01-05 (×2): 3 mL via INTRAVENOUS

## 2022-01-04 NOTE — ED Provider Notes (Signed)
[facility] Provider Note   CSN: [id number] Arrival date & time: 01/04/22  1118     History  Chief Complaint  Patient presents with   Loss of Consciousness   Fall    Donald BALEY is a 61 y.o. male with a past medical history significant for hyperlipidemia, hypertension, history of NSTEMI in 2015 who presents to the ED after a syncopal episode that occurred last night.  Patient states he felt acutely lightheaded with some chest pain while walking around the house and then woke up on the floor.  Patient admits to hitting the right side of his head.  He is currently on Plavix.  Patient notes he has been having intermittent chest pain since Thanksgiving that has become more frequent.  Patient notes whenever he performs any strenuous activity he has chest pain which is relieved with rest.  Chest pain associated with shortness of breath. He has finished a whole bottle of nitroglycerin over the past few months due to increase in chest pain.  Patient has a cardiology appointment next week.  Patient notes after the head injury he is felt "foggy".  Denies visual changes, speech changes, unilateral weakness. Denies orthopnea. He admits to bilateral lower extremity edema which he was told by his PCP that it could be related to his amlodipine. No history of CHF.        Home Medications Prior to Admission medications   Medication Sig Start Date End Date Taking? Authorizing Provider  amLODipine (NORVASC) 5 MG tablet Take 5 mg by mouth daily.    [provider]  carvedilol (COREG) 3.125 MG tablet TAKE 1 TABLET BY MOUTH TWO TIMES DAILY 10/20/18   Jettie Booze, MD  clopidogrel (PLAVIX) 75 MG tablet Take 1 tablet (75 mg total) by mouth daily. Please make overdue appt with Dr. Irish Lack before anymore refills. 2nd attempt 09/01/19   Jettie Booze, MD  dapsone 100 MG tablet Take 100 mg by mouth daily.    [provider]  ezetimibe (ZETIA) 10 MG  tablet Take 10 mg by mouth daily.    [provider]  hydrochlorothiazide (HYDRODIURIL) 25 MG tablet Take 1 tablet (25 mg total) by mouth daily. Please make overdue appt with Dr. Irish Lack before anymore refills. 2nd attempt 09/01/19   Jettie Booze, MD  nitroGLYCERIN (NITROSTAT) 0.4 MG SL tablet Place 1 tablet (0.4 mg total) under the tongue every 5 (five) minutes x 3 doses as needed. For chest pain 12/27/21   Jettie Booze, MD  Omega-3 Fatty Acids (FISH OIL PO) Take 1,000 mg by mouth daily.     [provider]  rosuvastatin (CRESTOR) 10 MG tablet Take 1 tablet (10 mg total) by mouth daily. 07/08/18 09/12/20  Daune Perch, NP  telmisartan (MICARDIS) 40 MG tablet Take 40 mg by mouth daily. 09/04/20   [provider]      Allergies    Ciprofloxacin and Flagyl [metronidazole]    Review of Systems   Review of Systems  Constitutional:  Negative for chills and fever.  Respiratory:  Positive for shortness of breath.   Cardiovascular:  Positive for chest pain and leg swelling.  Gastrointestinal:  Negative for abdominal pain, nausea and vomiting.  Neurological:  Positive for syncope and light-headedness. Negative for speech difficulty and weakness.   Physical Exam Updated Vital Signs BP 127/64 (BP Location: Right Arm)    Pulse 74    Temp 98.5 F (36.9 C) (Oral)    Resp  14    Ht 6' (1.829 m)    Wt 109.8 kg    SpO2 96%    BMI 32.82 kg/m  Physical Exam Vitals and nursing note reviewed.  Constitutional:      General: He is not in acute distress.    Appearance: He is not ill-appearing.  HENT:     Head: Normocephalic.  Eyes:     Pupils: Pupils are equal, round, and reactive to light.  Cardiovascular:     Rate and Rhythm: Normal rate and regular rhythm.     Pulses: Normal pulses.     Heart sounds: Normal heart sounds. No murmur heard.   No friction rub. No gallop.  Pulmonary:     Effort: Pulmonary effort is normal.     Breath sounds: Normal breath  sounds.  Abdominal:     General: Abdomen is flat. There is no distension.     Palpations: Abdomen is soft.     Tenderness: There is no abdominal tenderness. There is no guarding or rebound.  Musculoskeletal:        General: Normal range of motion.     Cervical back: Neck supple.     Comments: 1+ pitting edema bilaterally.  Skin:    General: Skin is warm and dry.  Neurological:     General: No focal deficit present.     Mental Status: He is alert.     Comments: Speech is clear, able to follow commands CN III-XII intact Normal strength in upper and lower extremities bilaterally including dorsiflexion and plantar flexion, strong and equal grip strength Sensation grossly intact throughout Moves extremities without ataxia, coordination intact No pronator drift   Psychiatric:        Mood and Affect: Mood normal.        Behavior: Behavior normal.    ED Results / Procedures / Treatments   Labs (all labs ordered are listed, but only abnormal results are displayed) Labs Reviewed  CBC WITH DIFFERENTIAL/PLATELET - Abnormal; Notable for the following components:      Result Value   RBC 4.14 (*)    Hemoglobin 12.8 (*)    All other components within normal limits  BASIC METABOLIC PANEL - Abnormal; Notable for the following components:   Potassium 3.2 (*)    Glucose, Bld 112 (*)    All other components within normal limits  RESP PANEL BY RT-PCR (FLU A&B, COVID) ARPGX2  BRAIN NATRIURETIC PEPTIDE  TROPONIN I (HIGH SENSITIVITY)  TROPONIN I (HIGH SENSITIVITY)    EKG EKG Interpretation  Date/Time:  Thursday January 04 2022 11:32:37 EST Ventricular Rate:  71 PR Interval:  186 QRS Duration: 108 QT Interval:  403 QTC Calculation: 438 R Axis:   58 Text Interpretation: Sinus rhythm Borderline repolarization abnormality No significant change since last tracing Confirmed by Lennice Sites ([id number]) on 01/04/2022 11:35:05 AM  Radiology CT Head Wo Contrast  Result Date: 01/04/2022 CLINICAL DATA:   Head trauma moderate to severe EXAM: CT HEAD WITHOUT CONTRAST CT CERVICAL SPINE WITHOUT CONTRAST TECHNIQUE: Multidetector CT imaging of the head and cervical spine was performed following the standard protocol without intravenous contrast. Multiplanar CT image reconstructions of the cervical spine were also generated. RADIATION DOSE REDUCTION: This exam was performed according to the departmental dose-optimization program which includes automated exposure control, adjustment of the mA and/or kV according to patient size and/or use of iterative reconstruction technique. COMPARISON:  None. FINDINGS: CT HEAD FINDINGS Brain: No evidence of acute infarction, hemorrhage, hydrocephalus, extra-axial collection or mass  lesion/mass effect. Vascular: Negative for hyperdense vessel Skull: Negative Sinuses/Orbits: Negative Other: None CT CERVICAL SPINE FINDINGS Alignment: Normal alignment.  Mild dextroscoliosis Skull base and vertebrae: Negative for fracture or mass Soft tissues and spinal canal: Right thyroid mass 4.5 cm in diameter with central low density and a few punctate calcifications. No enlarged lymph nodes in the neck. Disc levels: Severe right foraminal encroachment at C5-6 due to prominent uncinate spurring. Central disc protrusion and spinal stenosis also at C5-6. Moderate foraminal encroachment bilaterally at C6-7 with mild spinal stenosis Upper chest: Lung apices clear. Other: None IMPRESSION: Negative CT head Negative for cervical fracture. Cervical spondylosis with severe right foraminal encroachment at C5-6. Moderate foraminal encroachment bilaterally at C6-7 4.5 cm mass right thyroid. This is increased in size since the CT neck of 05/11/2017 when the nodule measured 20 x 24 x 30 mm. Recommend thyroid ultrasound for further evaluation. (Ref: J Am Coll Radiol. 2015 Feb;12(2): 143-50). Electronically Signed   By: Franchot Gallo M.D.   On: 01/04/2022 12:16   CT Cervical Spine Wo Contrast  Result Date:  01/04/2022 CLINICAL DATA:  Head trauma moderate to severe EXAM: CT HEAD WITHOUT CONTRAST CT CERVICAL SPINE WITHOUT CONTRAST TECHNIQUE: Multidetector CT imaging of the head and cervical spine was performed following the standard protocol without intravenous contrast. Multiplanar CT image reconstructions of the cervical spine were also generated. RADIATION DOSE REDUCTION: This exam was performed according to the departmental dose-optimization program which includes automated exposure control, adjustment of the mA and/or kV according to patient size and/or use of iterative reconstruction technique. COMPARISON:  None. FINDINGS: CT HEAD FINDINGS Brain: No evidence of acute infarction, hemorrhage, hydrocephalus, extra-axial collection or mass lesion/mass effect. Vascular: Negative for hyperdense vessel Skull: Negative Sinuses/Orbits: Negative Other: None CT CERVICAL SPINE FINDINGS Alignment: Normal alignment.  Mild dextroscoliosis Skull base and vertebrae: Negative for fracture or mass Soft tissues and spinal canal: Right thyroid mass 4.5 cm in diameter with central low density and a few punctate calcifications. No enlarged lymph nodes in the neck. Disc levels: Severe right foraminal encroachment at C5-6 due to prominent uncinate spurring. Central disc protrusion and spinal stenosis also at C5-6. Moderate foraminal encroachment bilaterally at C6-7 with mild spinal stenosis Upper chest: Lung apices clear. Other: None IMPRESSION: Negative CT head Negative for cervical fracture. Cervical spondylosis with severe right foraminal encroachment at C5-6. Moderate foraminal encroachment bilaterally at C6-7 4.5 cm mass right thyroid. This is increased in size since the CT neck of 05/11/2017 when the nodule measured 20 x 24 x 30 mm. Recommend thyroid ultrasound for further evaluation. (Ref: J Am Coll Radiol. 2015 Feb;12(2): 143-50). Electronically Signed   By: Franchot Gallo M.D.   On: 01/04/2022 12:16   DG Chest Portable 1  View  Result Date: 01/04/2022 CLINICAL DATA:  Syncope last night. EXAM: PORTABLE CHEST 1 VIEW COMPARISON:  07/23/2014 FINDINGS: Normal heart size. High right paratracheal opacity and tracheal deviation correlating with thyroid nodule on in-progress cervical spine CT. There is no edema, consolidation, effusion, or pneumothorax. IMPRESSION: 1. No evidence of acute cardiopulmonary disease. 2. Right apical opacity with tracheal deviation correlating with thyroid nodule by cervical spine CT. Recommend follow-up given the change since prior radiography in 2012 and neck CT in 2018 Electronically Signed   By: Jorje Guild M.D.   On: 01/04/2022 12:20    Procedures Procedures    Medications Ordered in ED Medications  potassium chloride SA (KLOR-CON M) CR tablet 20 mEq (has no administration in time range)  ED Course/ Medical Decision Making/ A&P Clinical Course as of 01/04/22 1350  Thu Jan 04, 2022  1300 Potassium(!): 3.2 [CA]    Clinical Course User Index [CA] Suzy Bouchard, PA-C                           Medical Decision Making Amount and/or Complexity of Data Reviewed Independent Historian: spouse    Details: Wife at bedside provided some history External Data Reviewed: notes.    Details: previous cardiology notes Labs: ordered. Decision-making details documented in ED Course. Radiology: ordered and independent interpretation performed. Decision-making details documented in ED Course. ECG/medicine tests: ordered and independent interpretation performed. Decision-making details documented in ED Course.  Risk Prescription drug management. Decision regarding hospitalization.   61 year old male presents to the ED after a syncopal episode that occurred last night.  Syncopal episode preceded by lightheadedness and chest pain while walking around his house.  Previous history of NSTEMI.  Patient notes he has been having intermittent chest pain since Thanksgiving with any strenuous  activity associated with shortness of breath.  Patient notes chest pain is typically relieved with rest and a nitroglycerin.  He is currently chest pain-free.  Patient is currently on Plavix.  Denies nausea and vomiting.  Patient notes he has felt "foggy" since the head injury.  No visual changes, speech changes, or unilateral weakness.  Upon arrival, stable vitals.  Patient in no acute distress.  Normal neurological exam without any neurological deficits.  1+ pitting edema bilaterally.  Will obtain cardiac labs to rule out ACS.  High concern for unstable angina.  CT head and cervical spine due to head injury and patient is currently on Plavix rule out intracranial abnormalities.  BNP to rule out CHF given lower extremity edema and worsening shortness of breath.  Discussed with Dr. Ronnald Nian who agrees with assessment and plan.  CBC reassuring.  No leukocytosis.  Mild anemia with heme at 12.8.  Initial troponin normal at 5.  COVID/influenza negative.  BNP normal.  Low suspicion for CHF.  BMP significant for hypokalemia 3.2.  Hyperglycemia 112 without evidence of DKA.  Potassium repleted here in the ED.  Chest x-ray personally reviewed and interpreted which demonstrates a right apical opacity likely thyroid nodule.  No evidence of pneumonia, pneumothorax, or widened mediastinum.  CT head and cervical spine negative for any acute abnormalities. EKG demonstrates NSR with no signs of acute ischemia. Patient will require admission for unstable angina.   1:38 PM Discussed with Dr. Harriet Masson with cardiology who agrees to admit patient for further evaluation of unstable angina. Temporary admission orders placed by Dr. Ronnald Nian.          Final Clinical Impression(s) / ED Diagnoses Final diagnoses:  Chest pain, unspecified type    Rx / DC Orders ED Discharge Orders     None         Suzy Bouchard, PA-C 01/04/22 [place], [place], DO 01/04/22 1353

## 2022-01-04 NOTE — ED Triage Notes (Signed)
Pt arrives ambulatory to ED with reports of syncopal episode last night causing him to fall and hit his head on a doorway. Pt is on Plavix. Also reports over the last few weeks he has been having some CP and is seen by cardiologist for it. States he has felt 'cloudy' sent last night. Hematoma to right forehead.  ?

## 2022-01-04 NOTE — ED Notes (Signed)
ED TO INPATIENT HANDOFF REPORT  ED Nurse Name and Phone #: Baxter Flattery, RN  S Name/Age/Gender Donald Torres 61 y.o. male Room/Bed: MH11/MH11  Code Status   Code Status: Prior  Home/SNF/Other Home Patient oriented to: self, place, time, and situation Is this baseline? Yes   Triage Complete: Triage complete  Chief Complaint Chest pain [R07.9]  Triage Note Pt arrives ambulatory to ED with reports of syncopal episode last night causing him to fall and hit his head on a doorway. Pt is on Plavix. Also reports over the last few weeks he has been having some CP and is seen by cardiologist for it. States he has felt 'cloudy' sent last night. Hematoma to right forehead.    Allergies Allergies  Allergen Reactions   Ciprofloxacin Rash   Flagyl [Metronidazole] Rash    Level of Care/Admitting Diagnosis ED Disposition     ED Disposition  Admit   Condition  --   Comment  Hospital Area: [facility] [[id number]]  Level of Care: Telemetry Cardiac [103]  Interfacility transfer: Yes  May place patient in observation at [facility] or [facility] if equivalent level of care is available:: No  Covid Evaluation: Asymptomatic Screening Protocol (No Symptoms)  Diagnosis: Chest pain [716967]  Admitting Physician: Berniece Salines [[id number]]  Attending Physician: Berniece Salines [[id number]]          B Medical/Surgery History Past Medical History:  Diagnosis Date   CAD (coronary artery disease)    NSTEMI 06/2014 >>> LHC (9/15):  Mid LAD 90% with mobile thrombus, EF 60% >> PCI:  Aspiration thrombectomy + 2.75 x 28 Promus DES to mid LAD   High cholesterol    Hx of echocardiogram    Echo (9/15):  EF 55-60%, no RWMA, Gr 1 DD, mild BAE   Hypertension    Past Surgical History:  Procedure Laterality Date   HERNIA REPAIR     LEFT HEART CATHETERIZATION WITH CORONARY ANGIOGRAM N/A 07/26/2014   Procedure: LEFT HEART CATHETERIZATION WITH CORONARY ANGIOGRAM;  Surgeon: Jettie Booze, MD;   Location: [facility] CATH LAB;  Service: Cardiovascular;  Laterality: N/A;     A IV Location/Drains/Wounds Patient Lines/Drains/Airways Status     Active Line/Drains/Airways     Name Placement date Placement time Site Days   Peripheral IV 01/04/22 20 G 1" Anterior;Distal;Left;Upper Arm 01/04/22  1148  Arm  less than 1            Intake/Output Last 24 hours No intake or output data in the 24 hours ending 01/04/22 1455  Labs/Imaging Results for orders placed or performed during the hospital encounter of 01/04/22 (from the past 48 hour(s))  CBC with Differential     Status: Abnormal   Collection Time: 01/04/22 11:47 AM  Result Value Ref Range   WBC 5.7 4.0 - 10.5 K/uL   RBC 4.14 (L) 4.22 - 5.81 MIL/uL   Hemoglobin 12.8 (L) 13.0 - 17.0 g/dL   HCT 39.2 39.0 - 52.0 %   MCV 94.7 80.0 - 100.0 fL   MCH 30.9 26.0 - 34.0 pg   MCHC 32.7 30.0 - 36.0 g/dL   RDW 15.1 11.5 - 15.5 %   Platelets 220 150 - 400 K/uL   nRBC 0.0 0.0 - 0.2 %   Neutrophils Relative % 64 %   Neutro Abs 3.7 1.7 - 7.7 K/uL   Lymphocytes Relative 20 %   Lymphs Abs 1.2 0.7 - 4.0 K/uL   Monocytes Relative 12 %   Monocytes  Absolute 0.7 0.1 - 1.0 K/uL   Eosinophils Relative 3 %   Eosinophils Absolute 0.2 0.0 - 0.5 K/uL   Basophils Relative 1 %   Basophils Absolute 0.1 0.0 - 0.1 K/uL   Immature Granulocytes 0 %   Abs Immature Granulocytes 0.02 0.00 - 0.07 K/uL    Comment: Performed at [facility], [address] [place], [place] [address]  Basic metabolic panel     Status: Abnormal   Collection Time: 01/04/22 11:47 AM  Result Value Ref Range   Sodium 139 135 - 145 mmol/L   Potassium 3.2 (L) 3.5 - 5.1 mmol/L   Chloride 102 98 - 111 mmol/L   CO2 28 22 - 32 mmol/L   Glucose, Bld 112 (H) 70 - 99 mg/dL    Comment: Glucose reference range applies only to samples taken after fasting for at least 8 hours.   BUN 13 6 - 20 mg/dL   Creatinine, Ser 0.84 0.61 - 1.24 mg/dL   Calcium 9.2 8.9 - 10.3 mg/dL   GFR,  Estimated >60 >60 mL/min    Comment: (NOTE) Calculated using the CKD-EPI Creatinine Equation (2021)    Anion gap 9 5 - 15    Comment: Performed at [facility], [address] [place], [place] [address]  Troponin I (High Sensitivity)     Status: None   Collection Time: 01/04/22 11:47 AM  Result Value Ref Range   Troponin I (High Sensitivity) 5 <18 ng/L    Comment: (NOTE) Elevated high sensitivity troponin I (hsTnI) values and significant  changes across serial measurements may suggest ACS but many other  chronic and acute conditions are known to elevate hsTnI results.  Refer to the "Links" section for chest pain algorithms and additional  guidance. Performed at [facility], [address] [place], [place] [address]   Brain natriuretic peptide     Status: None   Collection Time: 01/04/22 11:47 AM  Result Value Ref Range   B Natriuretic Peptide 38.2 0.0 - 100.0 pg/mL    Comment: Performed at [facility], [address] [place], [place] [address]  Resp Panel by RT-PCR (Flu A&B, Covid) Nasopharyngeal Swab     Status: None   Collection Time: 01/04/22 11:47 AM   Specimen: Nasopharyngeal Swab; Nasopharyngeal(NP) swabs in vial transport medium  Result Value Ref Range   SARS Coronavirus 2 by RT PCR NEGATIVE NEGATIVE    Comment: (NOTE) SARS-CoV-2 target nucleic acids are NOT DETECTED.  The SARS-CoV-2 RNA is generally detectable in upper respiratory specimens during the acute phase of infection. The lowest concentration of SARS-CoV-2 viral copies this assay can detect is 138 copies/mL. A negative result does not preclude SARS-Cov-2 infection and should not be used as the sole basis for treatment or other patient management decisions. A negative result may occur with  improper specimen collection/handling, submission of specimen other than nasopharyngeal swab, presence of viral mutation(s) within the areas targeted by this assay, and inadequate number  of viral copies(<138 copies/mL). A negative result must be combined with clinical observations, patient history, and epidemiological information. The expected result is Negative.  Fact Sheet for Patients:  EntrepreneurPulse.com.au  Fact Sheet for Healthcare Providers:  IncredibleEmployment.be  This test is no t yet approved or cleared by the [place] FDA and  has been authorized for detection and/or diagnosis of SARS-CoV-2 by FDA under an Emergency Use Authorization (EUA). This EUA will remain  in effect (  meaning this test can be used) for the duration of the COVID-19 declaration under Section 564(b)(1) of the Act, 21 U.S.C.section 360bbb-3(b)(1), unless the authorization is terminated  or revoked sooner.       Influenza A by PCR NEGATIVE NEGATIVE   Influenza B by PCR NEGATIVE NEGATIVE    Comment: (NOTE) The Xpert Xpress SARS-CoV-2/FLU/RSV plus assay is intended as an aid in the diagnosis of influenza from Nasopharyngeal swab specimens and should not be used as a sole basis for treatment. Nasal washings and aspirates are unacceptable for Xpert Xpress SARS-CoV-2/FLU/RSV testing.  Fact Sheet for Patients: EntrepreneurPulse.com.au  Fact Sheet for Healthcare Providers: IncredibleEmployment.be  This test is not yet approved or cleared by the [place] FDA and has been authorized for detection and/or diagnosis of SARS-CoV-2 by FDA under an Emergency Use Authorization (EUA). This EUA will remain in effect (meaning this test can be used) for the duration of the COVID-19 declaration under Section 564(b)(1) of the Act, 21 U.S.C. section 360bbb-3(b)(1), unless the authorization is terminated or revoked.  Performed at [facility], [address] [place], [place] [address]   Troponin I (High Sensitivity)     Status: None   Collection Time: 01/04/22  1:45 PM  Result Value Ref Range    Troponin I (High Sensitivity) 4 <18 ng/L    Comment: (NOTE) Elevated high sensitivity troponin I (hsTnI) values and significant  changes across serial measurements may suggest ACS but many other  chronic and acute conditions are known to elevate hsTnI results.  Refer to the "Links" section for chest pain algorithms and additional  guidance. Performed at [facility], [address] [place], [place] [address]    CT Head Wo Contrast  Result Date: 01/04/2022 CLINICAL DATA:  Head trauma moderate to severe EXAM: CT HEAD WITHOUT CONTRAST CT CERVICAL SPINE WITHOUT CONTRAST TECHNIQUE: Multidetector CT imaging of the head and cervical spine was performed following the standard protocol without intravenous contrast. Multiplanar CT image reconstructions of the cervical spine were also generated. RADIATION DOSE REDUCTION: This exam was performed according to the departmental dose-optimization program which includes automated exposure control, adjustment of the mA and/or kV according to patient size and/or use of iterative reconstruction technique. COMPARISON:  None. FINDINGS: CT HEAD FINDINGS Brain: No evidence of acute infarction, hemorrhage, hydrocephalus, extra-axial collection or mass lesion/mass effect. Vascular: Negative for hyperdense vessel Skull: Negative Sinuses/Orbits: Negative Other: None CT CERVICAL SPINE FINDINGS Alignment: Normal alignment.  Mild dextroscoliosis Skull base and vertebrae: Negative for fracture or mass Soft tissues and spinal canal: Right thyroid mass 4.5 cm in diameter with central low density and a few punctate calcifications. No enlarged lymph nodes in the neck. Disc levels: Severe right foraminal encroachment at C5-6 due to prominent uncinate spurring. Central disc protrusion and spinal stenosis also at C5-6. Moderate foraminal encroachment bilaterally at C6-7 with mild spinal stenosis Upper chest: Lung apices clear. Other: None IMPRESSION: Negative CT head Negative for  cervical fracture. Cervical spondylosis with severe right foraminal encroachment at C5-6. Moderate foraminal encroachment bilaterally at C6-7 4.5 cm mass right thyroid. This is increased in size since the CT neck of 05/11/2017 when the nodule measured 20 x 24 x 30 mm. Recommend thyroid ultrasound for further evaluation. (Ref: J Am Coll Radiol. 2015 Feb;12(2): 143-50). Electronically Signed   By: Franchot Gallo M.D.   On: 01/04/2022 12:16   CT Cervical Spine Wo Contrast  Result Date: 01/04/2022 CLINICAL DATA:  Head trauma moderate to  severe EXAM: CT HEAD WITHOUT CONTRAST CT CERVICAL SPINE WITHOUT CONTRAST TECHNIQUE: Multidetector CT imaging of the head and cervical spine was performed following the standard protocol without intravenous contrast. Multiplanar CT image reconstructions of the cervical spine were also generated. RADIATION DOSE REDUCTION: This exam was performed according to the departmental dose-optimization program which includes automated exposure control, adjustment of the mA and/or kV according to patient size and/or use of iterative reconstruction technique. COMPARISON:  None. FINDINGS: CT HEAD FINDINGS Brain: No evidence of acute infarction, hemorrhage, hydrocephalus, extra-axial collection or mass lesion/mass effect. Vascular: Negative for hyperdense vessel Skull: Negative Sinuses/Orbits: Negative Other: None CT CERVICAL SPINE FINDINGS Alignment: Normal alignment.  Mild dextroscoliosis Skull base and vertebrae: Negative for fracture or mass Soft tissues and spinal canal: Right thyroid mass 4.5 cm in diameter with central low density and a few punctate calcifications. No enlarged lymph nodes in the neck. Disc levels: Severe right foraminal encroachment at C5-6 due to prominent uncinate spurring. Central disc protrusion and spinal stenosis also at C5-6. Moderate foraminal encroachment bilaterally at C6-7 with mild spinal stenosis Upper chest: Lung apices clear. Other: None IMPRESSION: Negative CT  head Negative for cervical fracture. Cervical spondylosis with severe right foraminal encroachment at C5-6. Moderate foraminal encroachment bilaterally at C6-7 4.5 cm mass right thyroid. This is increased in size since the CT neck of 05/11/2017 when the nodule measured 20 x 24 x 30 mm. Recommend thyroid ultrasound for further evaluation. (Ref: J Am Coll Radiol. 2015 Feb;12(2): 143-50). Electronically Signed   By: Franchot Gallo M.D.   On: 01/04/2022 12:16   DG Chest Portable 1 View  Result Date: 01/04/2022 CLINICAL DATA:  Syncope last night. EXAM: PORTABLE CHEST 1 VIEW COMPARISON:  07/23/2014 FINDINGS: Normal heart size. High right paratracheal opacity and tracheal deviation correlating with thyroid nodule on in-progress cervical spine CT. There is no edema, consolidation, effusion, or pneumothorax. IMPRESSION: 1. No evidence of acute cardiopulmonary disease. 2. Right apical opacity with tracheal deviation correlating with thyroid nodule by cervical spine CT. Recommend follow-up given the change since prior radiography in 2012 and neck CT in 2018 Electronically Signed   By: Jorje Guild M.D.   On: 01/04/2022 12:20    Pending Labs Unresulted Labs (From admission, onward)    None       Vitals/Pain Today's Vitals   01/04/22 1310 01/04/22 1330 01/04/22 1400 01/04/22 1430  BP: 127/64 (!) 129/54 140/66 120/72  Pulse: 74 70 71 (!) 56  Resp: '14 16 18 14  '$ Temp:      TempSrc:      SpO2: 96% 96% 97% 96%  Weight:      Height:      PainSc:        Isolation Precautions No active isolations  Medications Medications  potassium chloride SA (KLOR-CON M) CR tablet 20 mEq (20 mEq Oral Given 01/04/22 1350)    Mobility walks Low fall risk   Focused Assessments Cardiac Assessment Handoff:  Cardiac Rhythm: Normal sinus rhythm Lab Results  Component Value Date   TROPONINI 0.56 ([place]) 07/24/2014   No results found for: DDIMER Does the Patient currently have chest pain? No     R Recommendations: See Admitting Provider Note  Report given to:   Additional Notes:

## 2022-01-04 NOTE — Plan of Care (Signed)

## 2022-01-04 NOTE — H&P (Addendum)
Cardiology Admission History and Physical:   [name] MRN: [id number]; DOB: 09/10/1961   Admission date: 01/04/2022  PCP:  Donald Torres, L.Marlou Sa, MD   [facility] Providers Cardiologist:  Larae Grooms, MD      Chief Complaint:  Chest Pain   Patient Profile:   Donald Torres is a 61 y.o. male with CAD s/p stent placement to the LAD, HTN, and tobacco use who is being seen 01/04/2022 for the evaluation of chest pain.  History of Present Illness:   Donald Torres is a 61 year old male with above medical history who is followed by Dr. Irish Torres. Per chart review, patient was admitted in 06/2014 with an non-STEMI. Underwent left heart cath that showed a 90% lesion in the LAD with mobile thrombus that was treated with aspiration thrombectomy and a DES. Echo at that time showed an EF of 55-60%, and grade I diastolic dysfunction. Later, patient was hospitalized in July 2017 for a dental infection, echocardiogram on 05/12/2017 showed an EF of 19-62%, grade I diastolic dysfunction. Patient was seen in December 2018 for chest pain, underwent a nuclear stress test on 10/24/2017 that was a normal, low risk study. Patient was last seen by cardiology in 2021, patient was doing well at that time and did not have any chest pain.   Patient presented to the ED on 3/9 complaining of a syncopal episode last night causing him to fall and hit his head on the door frame. Reported that he has also been having chest pain for the past few weeks.   Labs in the ED showed Na 139, K 3.2, creatinine 0.84, hemoglobin 12.8, WBC 5.7. hsTn 5>>4. BNP normal at 38.2.   CXR showed no acute cardiopulmonary disease. CT head showed no acute abnormalities. CT cervical spine was negative for fracture, but noted a 4.5 cm mass in the right thyroid.   On interview, patient reported that yesterday he had walked outside to get his mail, and he started to feel light headed once he got inside. He had some mild chest  discomfort. His dizziness got worse, but his chest pain remained mild. Denies any palpitations, SOB. Patient remembers waking up on the floor and his wife said that he had hit his head on the door frame. Patient has been having frequent episodes of dizziness upon standing in the past few weeks. He has also noticed having increasing episodes of chest discomfort and shortness of breath on exertion. Chest discomfort only occurs when he is exerting himself, never at rest. Discomfort is relieved with rest or nitroglycerin. He had planned to see his primary cardiologist (Dr. Irish Torres) in the clinic next week for evaluation. Currently, patient is chest pain free and denies dizziness, palpitations, SOB. Has some swelling in his ankles, unchanged. Believes it is caused by his amlodipine.    Past Medical History:  Diagnosis Date   CAD (coronary artery disease)    a. 06/2014 NSTEMI/PCI: LM nl, LAD 10mw/ mobile thrombus (aspiration thrombectomy & 2.75x28 Promus DES), LCX mild dzs mid, RCA mild dzs. EF 60%;  b. 09/2017 MV: EF 69%, no ischemia/infarct.   High cholesterol    Hx of echocardiogram    Echo (9/15):  EF 55-60%, no RWMA, Gr 1 DD, mild BAE   Hypertension     Past Surgical History:  Procedure Laterality Date   HERNIA REPAIR     LEFT HEART CATHETERIZATION WITH CORONARY ANGIOGRAM N/A 07/26/2014   Procedure: LEFT HEART CATHETERIZATION WITH CORONARY ANGIOGRAM;  Surgeon:  Jettie Booze, MD;  Location: [facility] CATH LAB;  Service: Cardiovascular;  Laterality: N/A;     Medications Prior to Admission: Prior to Admission medications   Medication Sig Start Date End Date Taking? Authorizing Provider  amLODipine (NORVASC) 5 MG tablet Take 5 mg by mouth daily.    [provider]  carvedilol (COREG) 3.125 MG tablet TAKE 1 TABLET BY MOUTH TWO TIMES DAILY 10/20/18   Jettie Booze, MD  clopidogrel (PLAVIX) 75 MG tablet Take 1 tablet (75 mg total) by mouth daily. Please make overdue appt with Dr.  Irish Torres before anymore refills. 2nd attempt 09/01/19   Jettie Booze, MD  dapsone 100 MG tablet Take 100 mg by mouth daily.    [provider]  ezetimibe (ZETIA) 10 MG tablet Take 10 mg by mouth daily.    [provider]  hydrochlorothiazide (HYDRODIURIL) 25 MG tablet Take 1 tablet (25 mg total) by mouth daily. Please make overdue appt with Dr. Irish Torres before anymore refills. 2nd attempt 09/01/19   Jettie Booze, MD  nitroGLYCERIN (NITROSTAT) 0.4 MG SL tablet Place 1 tablet (0.4 mg total) under the tongue every 5 (five) minutes x 3 doses as needed. For chest pain 12/27/21   Jettie Booze, MD  Omega-3 Fatty Acids (FISH OIL PO) Take 1,000 mg by mouth daily.     [provider]  rosuvastatin (CRESTOR) 10 MG tablet Take 1 tablet (10 mg total) by mouth daily. 07/08/18 09/12/20  Daune Perch, NP  telmisartan (MICARDIS) 40 MG tablet Take 40 mg by mouth daily. 09/04/20   [provider]     Allergies:    Allergies  Allergen Reactions   Ciprofloxacin Rash   Flagyl [Metronidazole] Rash    Social History:   Social History   Socioeconomic History   Marital status: Married    Spouse name: Not on file   Number of children: Not on file   Years of education: Not on file   Highest education level: Not on file  Occupational History   Not on file  Tobacco Use   Smoking status: Former    Packs/day: 1.00    Years: 36.00    Pack years: 36.00    Types: Cigarettes    Quit date: 07/23/2014    Years since quitting: 7.4   Smokeless tobacco: Never  Vaping Use   Vaping Use: Never used  Substance and Sexual Activity   Alcohol use: Yes   Drug use: No   Sexual activity: Not on file  Other Topics Concern   Not on file  Social History Narrative   Not on file   Social Determinants of Health   Financial Resource Strain: Not on file  Food Insecurity: Not on file  Transportation Needs: Not on file  Physical Activity: Not on file  Stress: Not on  file  Social Connections: Not on file  Intimate Partner Violence: Not on file    Family History:   The patient's family history includes Cancer in his mother; Diabetes in his brother; Heart attack in his brother, father, and unknown relative; Hypertension in his father and mother.    ROS:  Please see the history of present illness.  All other ROS reviewed and negative.     Physical Exam/Data:   Vitals:   01/04/22 1400 01/04/22 1430 01/04/22 1500 01/04/22 1700  BP: 140/66 120/72 134/68 (!) 150/78  Pulse: 71 (!) 56 67 66  Resp: '18 14 18 18  '$ Temp:    98.6  F (37 C)  TempSrc:    Oral  SpO2: 97% 96% 96% 95%  Weight:    105.6 kg  Height:       No intake or output data in the 24 hours ending 01/04/22 1936 Last 3 Weights 01/04/2022 01/04/2022 09/12/2020  Weight (lbs) 232 lb 12.9 oz 242 lb 226 lb 3.2 oz  Weight (kg) 105.6 kg 109.77 kg 102.604 kg     Body mass index is 31.57 kg/m.  General:  Well nourished, well developed, in no acute distress. Laying comfortably in the bed HEENT: normal Neck: no JVD, supple Vascular: No carotid bruits; Distal pulses 2+ bilaterally   Cardiac:  normal S1, S2; RRR; no murmur  Lungs:  clear to auscultation bilaterally, no wheezing, rhonchi or rales  Abd: soft, nontender, no hepatomegaly  Ext: 1+ pitting edema in BLE Musculoskeletal:  No deformities, BUE and BLE strength normal and equal Skin: warm and dry  Neuro:  CNs 2-12 intact, no focal abnormalities noted Psych:  Normal affect    EKG:  The ECG that was done on 3/9 was personally reviewed and demonstrates normal sinus rhythm, HR 71  Relevant CV Studies:   Laboratory Data:  High Sensitivity Troponin:   Recent Labs  Lab 01/04/22 1147 01/04/22 1345  TROPONINIHS 5 4      Chemistry Recent Labs  Lab 01/04/22 1147  NA 139  K 3.2*  CL 102  CO2 28  GLUCOSE 112*  BUN 13  CREATININE 0.84  CALCIUM 9.2  GFRNONAA >60  ANIONGAP 9    No results for input(s): PROT, ALBUMIN, AST, ALT,  ALKPHOS, BILITOT in the last 168 hours. Lipids No results for input(s): CHOL, TRIG, HDL, LABVLDL, LDLCALC, CHOLHDL in the last 168 hours. Hematology Recent Labs  Lab 01/04/22 1147  WBC 5.7  RBC 4.14*  HGB 12.8*  HCT 39.2  MCV 94.7  MCH 30.9  MCHC 32.7  RDW 15.1  PLT 220   Thyroid No results for input(s): TSH, FREET4 in the last 168 hours. BNP Recent Labs  Lab 01/04/22 1147  BNP 38.2    DDimer No results for input(s): DDIMER in the last 168 hours.   Radiology/Studies:  CT Head Wo Contrast  Result Date: 01/04/2022 CLINICAL DATA:  Head trauma moderate to severe EXAM: CT HEAD WITHOUT CONTRAST CT CERVICAL SPINE WITHOUT CONTRAST TECHNIQUE: Multidetector CT imaging of the head and cervical spine was performed following the standard protocol without intravenous contrast. Multiplanar CT image reconstructions of the cervical spine were also generated. RADIATION DOSE REDUCTION: This exam was performed according to the departmental dose-optimization program which includes automated exposure control, adjustment of the mA and/or kV according to patient size and/or use of iterative reconstruction technique. COMPARISON:  None. FINDINGS: CT HEAD FINDINGS Brain: No evidence of acute infarction, hemorrhage, hydrocephalus, extra-axial collection or mass lesion/mass effect. Vascular: Negative for hyperdense vessel Skull: Negative Sinuses/Orbits: Negative Other: None CT CERVICAL SPINE FINDINGS Alignment: Normal alignment.  Mild dextroscoliosis Skull base and vertebrae: Negative for fracture or mass Soft tissues and spinal canal: Right thyroid mass 4.5 cm in diameter with central low density and a few punctate calcifications. No enlarged lymph nodes in the neck. Disc levels: Severe right foraminal encroachment at C5-6 due to prominent uncinate spurring. Central disc protrusion and spinal stenosis also at C5-6. Moderate foraminal encroachment bilaterally at C6-7 with mild spinal stenosis Upper chest: Lung apices  clear. Other: None IMPRESSION: Negative CT head Negative for cervical fracture. Cervical spondylosis with severe right foraminal encroachment at  C5-6. Moderate foraminal encroachment bilaterally at C6-7 4.5 cm mass right thyroid. This is increased in size since the CT neck of 05/11/2017 when the nodule measured 20 x 24 x 30 mm. Recommend thyroid ultrasound for further evaluation. (Ref: J Am Coll Radiol. 2015 Feb;12(2): 143-50). Electronically Signed   By: Franchot Gallo M.D.   On: 01/04/2022 12:16   CT Cervical Spine Wo Contrast  Result Date: 01/04/2022 CLINICAL DATA:  Head trauma moderate to severe EXAM: CT HEAD WITHOUT CONTRAST CT CERVICAL SPINE WITHOUT CONTRAST TECHNIQUE: Multidetector CT imaging of the head and cervical spine was performed following the standard protocol without intravenous contrast. Multiplanar CT image reconstructions of the cervical spine were also generated. RADIATION DOSE REDUCTION: This exam was performed according to the departmental dose-optimization program which includes automated exposure control, adjustment of the mA and/or kV according to patient size and/or use of iterative reconstruction technique. COMPARISON:  None. FINDINGS: CT HEAD FINDINGS Brain: No evidence of acute infarction, hemorrhage, hydrocephalus, extra-axial collection or mass lesion/mass effect. Vascular: Negative for hyperdense vessel Skull: Negative Sinuses/Orbits: Negative Other: None CT CERVICAL SPINE FINDINGS Alignment: Normal alignment.  Mild dextroscoliosis Skull base and vertebrae: Negative for fracture or mass Soft tissues and spinal canal: Right thyroid mass 4.5 cm in diameter with central low density and a few punctate calcifications. No enlarged lymph nodes in the neck. Disc levels: Severe right foraminal encroachment at C5-6 due to prominent uncinate spurring. Central disc protrusion and spinal stenosis also at C5-6. Moderate foraminal encroachment bilaterally at C6-7 with mild spinal stenosis Upper  chest: Lung apices clear. Other: None IMPRESSION: Negative CT head Negative for cervical fracture. Cervical spondylosis with severe right foraminal encroachment at C5-6. Moderate foraminal encroachment bilaterally at C6-7 4.5 cm mass right thyroid. This is increased in size since the CT neck of 05/11/2017 when the nodule measured 20 x 24 x 30 mm. Recommend thyroid ultrasound for further evaluation. (Ref: J Am Coll Radiol. 2015 Feb;12(2): 143-50). Electronically Signed   By: Franchot Gallo M.D.   On: 01/04/2022 12:16   DG Chest Portable 1 View  Result Date: 01/04/2022 CLINICAL DATA:  Syncope last night. EXAM: PORTABLE CHEST 1 VIEW COMPARISON:  07/23/2014 FINDINGS: Normal heart size. High right paratracheal opacity and tracheal deviation correlating with thyroid nodule on in-progress cervical spine CT. There is no edema, consolidation, effusion, or pneumothorax. IMPRESSION: 1. No evidence of acute cardiopulmonary disease. 2. Right apical opacity with tracheal deviation correlating with thyroid nodule by cervical spine CT. Recommend follow-up given the change since prior radiography in 2012 and neck CT in 2018 Electronically Signed   By: Jorje Guild M.D.   On: 01/04/2022 12:20     Assessment and Plan:   Chest pain, Progressive Angina  - Patient had an NSTEMI in 06/2014 and underwent a LHC with DES to the LAD  - Stress test in 2018 was normal - Patient reports that he has been experiencing chest pain on exertion that has been more frequent over the past few months. Pain is relieved with rest, nitro.  - hsTn 5>>4 - EKG without signs of ischemia  - Ordered echocardiogram  - Patient feels like his chest pain and SOB is starting to limit his day to day activities, so we will plan for [facility] tomorrow    Syncope  - Patient had an episode of syncope yesterday, hit his head on a door post  - CT head was negative, CT cervical negative for fracture  - Patient reports frequent episodes dizziness on  standing in  the past few weeks   - Ordered orthostatic vitals  -  Ordered echocardiogram  - Consider long-term monitor at discharge  Thyroid Mass  - 4.5 cm thyroid mass identified on imaging  - TSH pending - Recommended outpatient follow up    Risk Assessment/Risk Scores:    HEAR Score (for undifferentiated chest pain):  HEAR Score: 4{    Severity of Illness: The appropriate patient status for this patient is OBSERVATION. Observation status is judged to be reasonable and necessary in order to provide the required intensity of service to ensure the patient's safety. The patient's presenting symptoms, physical exam findings, and initial radiographic and laboratory data in the context of their medical condition is felt to place them at decreased risk for further clinical deterioration. Furthermore, it is anticipated that the patient will be medically stable for discharge from the hospital within 2 midnights of admission.    For questions or updates, please contact [place] Please consult [email or web address] for contact info under     Signed, Margie Billet, PA-C  01/04/2022 7:36 PM   Patient seen and examined and agree with Vikki Ports, PA-C as detailed above.  In brief, the patient is a 61 year old male with history of CAD s/p PCI to the LAD, HTN, and tobacco abuse who presents with chest pain and syncope now admitted for further management.  Patient with known history of CAD with NSTEMI in 2015 with 90% LAD lesion s/p PCI at that time. Most recent TTE in 04/2017 with LVEF 65-70%, G1DD, mild MR. Myoview 2018 with normal EF, no ischemia or infarction.   He has been doing well until this summer where he began to develop progressive chest pain and dyspnea on exertion. No rest symptoms, but the patient states he has been far less active due to symptoms. He was planned to see Dr. Irish Torres but presented here due to syncope and chest pain as detailed above.  Currently, the patient states that  yesterday, he began to feel very lightheaded with associated chest pressure while walking. He was going to try to sit down but syncopized prior to making it to the kitchen chair. Was only out for several seconds before coming to. No preceding palpitations and no known history of arrhythmias. He did not come in the ER at that time but called his PCP in the morning who recommended he go to urgent care to be evaluated.  Here, hsTn 5>>4. BNP normal at 38.2. ECG with NSR, nonspecific ST-T wave changes. CXR showed no acute cardiopulmonary disease. CT head showed no acute abnormalities. CT cervical spine was negative for fracture, but noted a 4.5 cm mass in the right thyroid.   The patient is currently comfortable and chest pain free. He notably monitors his blood pressure at home as coreg was recently increased with only one asymptomatic reading with SBP 90s. Rest of BPs including following his syncopal episode with SBP 120s. Given progressive anginal symptoms and recent episode of syncope, will proceed with ischemic evaluation with cath in the AM. If unrevealing, will likely need cardiac monitor on discharge.   Exam: GEN: No acute distress.   Neck: No JVD Cardiac: RRR, no murmurs, rubs, or gallops.  Respiratory: Clear to auscultation bilaterally. GI: Soft, nontender, non-distended  MS: No edema; No deformity. Neuro:  Nonfocal  Psych: Normal affect    Plan: -Plan for LHC in the AM -If LHC unrevealing, will need cardiac monitor on discharge -Check TTE -Continue ASA '81mg'$  daily,  plavix '75mg'$  daily -Continue crestor '10mg'$  daily, zetia '10mg'$  daily -Continue coreg 3.'125mg'$  BID -Holding amlodipine as has been having LE edema; will add ACE/ARB post-cath -Needs outpatient follow-up of thyroid mass  Plan discussed with patient and his wife at bedside and they are amenable to proceed.  INFORMED CONSENT: I have reviewed the risks, indications, and alternatives to cardiac catheterization, possible angioplasty,  and stenting with the patient. Risks include but are not limited to bleeding, infection, vascular injury, stroke, myocardial infection, arrhythmia, kidney injury, radiation-related injury in the case of prolonged fluoroscopy use, emergency cardiac surgery, and death. The patient understands the risks of serious complication is 1-2 in 3374 with diagnostic cardiac cath and 1-2% or less with angioplasty/stenting.    Gwyndolyn Kaufman, MD

## 2022-01-04 NOTE — ED Notes (Signed)
[facility] at bedside 

## 2022-01-05 ENCOUNTER — Inpatient Hospital Stay (HOSPITAL_BASED_OUTPATIENT_CLINIC_OR_DEPARTMENT_OTHER)
Admit: 2022-01-05 | Discharge: 2022-01-05 | Disposition: A | Discharge status: Home / Self Care | Payer: 59 | Attending: Cardiology | Admitting: Cardiology

## 2022-01-05 ENCOUNTER — Encounter (HOSPITAL_COMMUNITY)
Admission: EM | Disposition: A | Discharge status: Home / Self Care | Payer: Self-pay | Source: Home / Self Care | Attending: Emergency Medicine

## 2022-01-05 ENCOUNTER — Encounter (HOSPITAL_COMMUNITY): Payer: Self-pay | Admitting: Cardiovascular Disease

## 2022-01-05 ENCOUNTER — Other Ambulatory Visit: Payer: Self-pay | Admitting: Cardiology

## 2022-01-05 ENCOUNTER — Inpatient Hospital Stay (HOSPITAL_COMMUNITY): Payer: 59

## 2022-01-05 DIAGNOSIS — I2510 Atherosclerotic heart disease of native coronary artery without angina pectoris: Secondary | ICD-10-CM | POA: Diagnosis not present

## 2022-01-05 DIAGNOSIS — R55 Syncope and collapse: Secondary | ICD-10-CM

## 2022-01-05 DIAGNOSIS — I951 Orthostatic hypotension: Secondary | ICD-10-CM | POA: Diagnosis present

## 2022-01-05 DIAGNOSIS — I200 Unstable angina: Secondary | ICD-10-CM | POA: Diagnosis not present

## 2022-01-05 DIAGNOSIS — R079 Chest pain, unspecified: Secondary | ICD-10-CM

## 2022-01-05 HISTORY — PX: LEFT HEART CATH AND CORONARY ANGIOGRAPHY: CATH118249

## 2022-01-05 LAB — ECHOCARDIOGRAM COMPLETE
Area-P 1/2: 3.37 cm2
Height: 72 in
S' Lateral: 2.6 cm
Weight: 3682.56 oz

## 2022-01-05 LAB — LIPID PANEL
Cholesterol: 98 mg/dL (ref 0–200)
HDL: 32 mg/dL — ABNORMAL LOW (ref >40)
LDL Cholesterol: 49 mg/dL (ref 0–99)
Total CHOL/HDL Ratio: 3.1 RATIO
Triglycerides: 84 mg/dL (ref <150)
VLDL: 17 mg/dL (ref 0–40)

## 2022-01-05 LAB — BASIC METABOLIC PANEL
Anion gap: 9 (ref 5–15)
BUN: 11 mg/dL (ref 6–20)
CO2: 25 mmol/L (ref 22–32)
Calcium: 8.9 mg/dL (ref 8.9–10.3)
Chloride: 105 mmol/L (ref 98–111)
Creatinine, Ser: 0.95 mg/dL (ref 0.61–1.24)
GFR, Estimated: >60 mL/min (ref >60)
Glucose, Bld: 85 mg/dL (ref 70–99)
Potassium: 4.1 mmol/L (ref 3.5–5.1)
Sodium: 139 mmol/L (ref 135–145)

## 2022-01-05 LAB — TSH: TSH: 2.375 u[IU]/mL (ref 0.350–4.500)

## 2022-01-05 SURGERY — LEFT HEART CATH AND CORONARY ANGIOGRAPHY
Anesthesia: LOCAL

## 2022-01-05 MED ORDER — SODIUM CHLORIDE 0.9 % IV SOLN: 250.0000 mL | INTRAVENOUS | Status: DC | PRN

## 2022-01-05 MED ORDER — HEPARIN SODIUM (PORCINE) 1000 UNIT/ML IJ SOLN
INTRAMUSCULAR | Status: AC
  Filled 2022-01-05: qty 10

## 2022-01-05 MED ORDER — ASPIRIN 81 MG PO CHEW
81.0000 mg | CHEWABLE_TABLET | ORAL | Status: AC
  Administered 2022-01-05: 81 mg via ORAL
  Filled 2022-01-05: qty 1

## 2022-01-05 MED ORDER — FENTANYL CITRATE (PF) 100 MCG/2ML IJ SOLN
INTRAMUSCULAR | Status: AC
  Filled 2022-01-05: qty 2

## 2022-01-05 MED ORDER — FENTANYL CITRATE (PF) 100 MCG/2ML IJ SOLN
INTRAMUSCULAR | Status: DC | PRN
  Administered 2022-01-05: 50 ug via INTRAVENOUS

## 2022-01-05 MED ORDER — HEPARIN (PORCINE) IN NACL 1000-0.9 UT/500ML-% IV SOLN
INTRAVENOUS | Status: DC | PRN
  Administered 2022-01-05 (×2): 500 mL

## 2022-01-05 MED ORDER — SODIUM CHLORIDE 0.9% FLUSH: 3.0000 mL | Freq: Two times a day (BID) | INTRAVENOUS | Status: DC

## 2022-01-05 MED ORDER — SODIUM CHLORIDE 0.9 % WEIGHT BASED INFUSION
1.0000 mL/kg/h | INTRAVENOUS | Status: DC
Start: 2022-01-05 — End: 2022-01-05
  Administered 2022-01-05: 1 mL/kg/h via INTRAVENOUS

## 2022-01-05 MED ORDER — LIDOCAINE HCL (PF) 1 % IJ SOLN
INTRAMUSCULAR | Status: AC
  Filled 2022-01-05: qty 30

## 2022-01-05 MED ORDER — IOHEXOL 350 MG/ML SOLN
INTRAVENOUS | Status: DC | PRN
  Administered 2022-01-05: 50 mL

## 2022-01-05 MED ORDER — HEPARIN SODIUM (PORCINE) 5000 UNIT/ML IJ SOLN: 5000.0000 [IU] | Freq: Three times a day (TID) | INTRAMUSCULAR | Status: DC

## 2022-01-05 MED ORDER — VERAPAMIL HCL 2.5 MG/ML IV SOLN
INTRAVENOUS | Status: DC | PRN
  Administered 2022-01-05: 10 mL via INTRA_ARTERIAL

## 2022-01-05 MED ORDER — ASPIRIN 81 MG PO TBEC: 81.0000 mg | DELAYED_RELEASE_TABLET | Freq: Every day | ORAL | Status: AC

## 2022-01-05 MED ORDER — HEPARIN (PORCINE) IN NACL 1000-0.9 UT/500ML-% IV SOLN
INTRAVENOUS | Status: AC
  Filled 2022-01-05: qty 1000

## 2022-01-05 MED ORDER — HYDRALAZINE HCL 20 MG/ML IJ SOLN: 10.0000 mg | INTRAMUSCULAR | Status: DC | PRN

## 2022-01-05 MED ORDER — SODIUM CHLORIDE 0.9% FLUSH: 3.0000 mL | INTRAVENOUS | Status: DC | PRN

## 2022-01-05 MED ORDER — HEPARIN SODIUM (PORCINE) 1000 UNIT/ML IJ SOLN
INTRAMUSCULAR | Status: DC | PRN
Start: 2022-01-05 — End: 2022-01-05
  Administered 2022-01-05: 5000 [IU] via INTRAVENOUS

## 2022-01-05 MED ORDER — MIDAZOLAM HCL 2 MG/2ML IJ SOLN
INTRAMUSCULAR | Status: DC | PRN
Start: 2022-01-05 — End: 2022-01-05
  Administered 2022-01-05: 2 mg via INTRAVENOUS

## 2022-01-05 MED ORDER — VERAPAMIL HCL 2.5 MG/ML IV SOLN
INTRAVENOUS | Status: AC
Start: 2022-01-05 — End: ?
  Filled 2022-01-05: qty 2

## 2022-01-05 MED ORDER — SODIUM CHLORIDE 0.9 % WEIGHT BASED INFUSION
3.0000 mL/kg/h | INTRAVENOUS | Status: DC
  Administered 2022-01-05: 3 mL/kg/h via INTRAVENOUS

## 2022-01-05 MED ORDER — MIDAZOLAM HCL 2 MG/2ML IJ SOLN
INTRAMUSCULAR | Status: AC
  Filled 2022-01-05: qty 2

## 2022-01-05 MED ORDER — LABETALOL HCL 5 MG/ML IV SOLN: 10.0000 mg | INTRAVENOUS | Status: DC | PRN

## 2022-01-05 MED ORDER — SODIUM CHLORIDE 0.9 % IV SOLN: INTRAVENOUS | Status: DC

## 2022-01-05 MED ORDER — LIDOCAINE HCL (PF) 1 % IJ SOLN
INTRAMUSCULAR | Status: DC | PRN
  Administered 2022-01-05: 2 mL via SUBCUTANEOUS

## 2022-01-05 SURGICAL SUPPLY — 10 items
CATH INFINITI 5 FR JL3.5 (CATHETERS) ×1 IMPLANT
CATH INFINITI JR4 5F (CATHETERS) ×1 IMPLANT
DEVICE RAD COMP TR BAND LRG (VASCULAR PRODUCTS) ×1 IMPLANT
GLIDESHEATH SLEND SS 6F .021 (SHEATH) ×1 IMPLANT
GUIDEWIRE INQWIRE 1.5J.035X260 (WIRE) IMPLANT
INQWIRE 1.5J .035X260CM (WIRE) ×2
KIT HEART LEFT (KITS) ×2 IMPLANT
PACK CARDIAC CATHETERIZATION (CUSTOM PROCEDURE TRAY) ×2 IMPLANT
TRANSDUCER W/STOPCOCK (MISCELLANEOUS) ×2 IMPLANT
TUBING CIL FLEX 10 FLL-RA (TUBING) ×2 IMPLANT

## 2022-01-05 NOTE — Progress Notes (Signed)
?  Echocardiogram ?2D Echocardiogram has been performed. ? ?Donald Torres ?01/05/2022, 8:27 AM ?

## 2022-01-05 NOTE — Plan of Care (Signed)

## 2022-01-05 NOTE — Plan of Care (Signed)
?Problem: Education: ?Goal: Knowledge of General Education information will improve ?Description: Including pain rating scale, medication(s)/side effects and non-pharmacologic comfort measures ?01/05/2022 1403 by Theotis Barrio, RN ?Outcome: Completed/Met ?01/05/2022 1148 by Theotis Barrio, RN ?Outcome: Progressing ?  ?Problem: Health Behavior/Discharge Planning: ?Goal: Ability to manage health-related needs will improve ?01/05/2022 1403 by Theotis Barrio, RN ?Outcome: Completed/Met ?01/05/2022 1148 by Theotis Barrio, RN ?Outcome: Progressing ?  ?Problem: Clinical Measurements: ?Goal: Ability to maintain clinical measurements within normal limits will improve ?01/05/2022 1403 by Theotis Barrio, RN ?Outcome: Completed/Met ?01/05/2022 1148 by Theotis Barrio, RN ?Outcome: Progressing ?Goal: Will remain free from infection ?01/05/2022 1403 by Theotis Barrio, RN ?Outcome: Completed/Met ?01/05/2022 1148 by Theotis Barrio, RN ?Outcome: Progressing ?Goal: Diagnostic test results will improve ?01/05/2022 1403 by Theotis Barrio, RN ?Outcome: Completed/Met ?01/05/2022 1148 by Theotis Barrio, RN ?Outcome: Progressing ?Goal: Respiratory complications will improve ?01/05/2022 1403 by Theotis Barrio, RN ?Outcome: Completed/Met ?01/05/2022 1148 by Theotis Barrio, RN ?Outcome: Progressing ?Goal: Cardiovascular complication will be avoided ?01/05/2022 1403 by Theotis Barrio, RN ?Outcome: Completed/Met ?01/05/2022 1148 by Theotis Barrio, RN ?Outcome: Progressing ?  ?Problem: Activity: ?Goal: Risk for activity intolerance will decrease ?01/05/2022 1403 by Theotis Barrio, RN ?Outcome: Completed/Met ?01/05/2022 1148 by Theotis Barrio, RN ?Outcome: Progressing ?  ?Problem: Nutrition: ?Goal: Adequate nutrition will be maintained ?01/05/2022 1403 by Theotis Barrio, RN ?Outcome: Completed/Met ?01/05/2022 1148 by Theotis Barrio, RN ?Outcome: Progressing ?  ?Problem: Coping: ?Goal: Level of anxiety will decrease ?01/05/2022  1403 by Theotis Barrio, RN ?Outcome: Completed/Met ?01/05/2022 1148 by Theotis Barrio, RN ?Outcome: Progressing ?  ?Problem: Elimination: ?Goal: Will not experience complications related to bowel motility ?01/05/2022 1403 by Theotis Barrio, RN ?Outcome: Completed/Met ?01/05/2022 1148 by Theotis Barrio, RN ?Outcome: Progressing ?Goal: Will not experience complications related to urinary retention ?01/05/2022 1403 by Theotis Barrio, RN ?Outcome: Completed/Met ?01/05/2022 1148 by Theotis Barrio, RN ?Outcome: Progressing ?  ?Problem: Pain Managment: ?Goal: General experience of comfort will improve ?01/05/2022 1403 by Theotis Barrio, RN ?Outcome: Completed/Met ?01/05/2022 1148 by Theotis Barrio, RN ?Outcome: Progressing ?  ?Problem: Safety: ?Goal: Ability to remain free from injury will improve ?01/05/2022 1403 by Theotis Barrio, RN ?Outcome: Completed/Met ?01/05/2022 1148 by Theotis Barrio, RN ?Outcome: Progressing ?  ?Problem: Skin Integrity: ?Goal: Risk for impaired skin integrity will decrease ?01/05/2022 1403 by Theotis Barrio, RN ?Outcome: Completed/Met ?01/05/2022 1148 by Theotis Barrio, RN ?Outcome: Progressing ?  ?Problem: Education: ?Goal: Understanding of CV disease, CV risk reduction, and recovery process will improve ?01/05/2022 1403 by Theotis Barrio, RN ?Outcome: Completed/Met ?01/05/2022 1148 by Theotis Barrio, RN ?Outcome: Progressing ?Goal: Individualized Educational Video(s) ?01/05/2022 1403 by Theotis Barrio, RN ?Outcome: Completed/Met ?01/05/2022 1148 by Theotis Barrio, RN ?Outcome: Progressing ?  ?Problem: Activity: ?Goal: Ability to return to baseline activity level will improve ?01/05/2022 1403 by Theotis Barrio, RN ?Outcome: Completed/Met ?01/05/2022 1148 by Theotis Barrio, RN ?Outcome: Progressing ?  ?Problem: Cardiovascular: ?Goal: Ability to achieve and maintain adequate cardiovascular perfusion will improve ?01/05/2022 1403 by Theotis Barrio, RN ?Outcome:  Completed/Met ?01/05/2022 1148 by Theotis Barrio, RN ?Outcome: Progressing ?Goal: Vascular access site(s) Level 0-1 will be maintained ?01/05/2022 1403 by Theotis Barrio, RN ?Outcome: Completed/Met ?01/05/2022 1148 by Theotis Barrio, RN ?Outcome: Progressing ?  ?Problem: Health Behavior/Discharge Planning: ?Goal: Ability to safely manage health-related needs after  discharge will improve ?01/05/2022 1403 by Theotis Barrio, RN ?Outcome: Completed/Met ?01/05/2022 1148 by Theotis Barrio, RN ?Outcome: Progressing ?  ?

## 2022-01-05 NOTE — Progress Notes (Signed)
Ordered carotid ultrasound to be completed as an outpatient for syncope and collapse ?

## 2022-01-05 NOTE — Progress Notes (Addendum)
Progress Note  Patient Name: Donald Torres Date of Encounter: 01/05/2022  [facility] Cardiologist: Larae Grooms, MD   Subjective   Patient is feeling well this AM, denies any chest pain, palpitations, SOB, dizziness. Is prepared for his cath today, does not have questions.   Inpatient Medications    Scheduled Meds:  aspirin EC  81 mg Oral Daily   carvedilol  3.125 mg Oral BID   clopidogrel  75 mg Oral Daily   ezetimibe  10 mg Oral Daily   heparin  5,000 Units Subcutaneous Q8H   irbesartan  150 mg Oral Daily   rosuvastatin  10 mg Oral Daily   sodium chloride flush  3 mL Intravenous Q12H   Continuous Infusions:  sodium chloride     sodium chloride 1 mL/kg/hr (01/05/22 0724)   PRN Meds: sodium chloride, acetaminophen, nitroGLYCERIN, ondansetron (ZOFRAN) IV, sodium chloride flush   Vital Signs    Vitals:   01/04/22 2239 01/04/22 2335 01/05/22 0400 01/05/22 0603  BP: 133/68 (!) 141/75 (!) 147/80   Pulse: 76 76 79   Resp:  16 17   Temp:  97.8 F (36.6 C) 98 F (36.7 C)   TempSrc:  Oral Oral   SpO2:  98% 99%   Weight:    104.4 kg  Height:        Intake/Output Summary (Last 24 hours) at 01/05/2022 0816 Last data filed at 01/05/2022 4196 Gross per 24 hour  Intake 0 ml  Output --  Net 0 ml   Last 3 Weights 01/05/2022 01/04/2022 01/04/2022  Weight (lbs) 230 lb 2.6 oz 232 lb 12.9 oz 242 lb  Weight (kg) 104.4 kg 105.6 kg 109.77 kg      Telemetry    Sinus rhythm, HR in the 50s-70s - Personally Reviewed  ECG     normal sinus rhythm, HR 71, nonspecific ST-T wave changes - Personally Reviewed  Physical Exam   GEN: No acute distress. Laying comfortably on the bed Neck: No JVD Cardiac: RRR, no murmurs, rubs, or gallops. Radial pulses 2+ bilaterally  Respiratory: Clear to auscultation bilaterally. Normal WOB  GI: Soft, nontender, non-distended  MS: Trace edema in BLE  Neuro:  Nonfocal  Psych: Normal affect   Labs    High Sensitivity Troponin:    Recent Labs  Lab 01/04/22 1147 01/04/22 1345  TROPONINIHS 5 4     Chemistry Recent Labs  Lab 01/04/22 1147 01/04/22 2006 01/05/22 0042  NA 139  --  139  K 3.2*  --  4.1  CL 102  --  105  CO2 28  --  25  GLUCOSE 112*  --  85  BUN 13  --  11  CREATININE 0.84 0.92 0.95  CALCIUM 9.2  --  8.9  GFRNONAA >60 >60 >60  ANIONGAP 9  --  9    Lipids  Recent Labs  Lab 01/05/22 0042  CHOL 98  TRIG 84  HDL 32*  LDLCALC 49  CHOLHDL 3.1    Hematology Recent Labs  Lab 01/04/22 1147 01/04/22 2006  WBC 5.7 6.6  RBC 4.14* 4.13*  HGB 12.8* 12.7*  HCT 39.2 38.9*  MCV 94.7 94.2  MCH 30.9 30.8  MCHC 32.7 32.6  RDW 15.1 14.8  PLT 220 201   Thyroid  Recent Labs  Lab 01/05/22 0042  TSH 2.375    BNP Recent Labs  Lab 01/04/22 1147  BNP 38.2    DDimer No results for input(s): DDIMER in the last 168  hours.   Radiology    CT Head Wo Contrast  Result Date: 01/04/2022 CLINICAL DATA:  Head trauma moderate to severe EXAM: CT HEAD WITHOUT CONTRAST CT CERVICAL SPINE WITHOUT CONTRAST TECHNIQUE: Multidetector CT imaging of the head and cervical spine was performed following the standard protocol without intravenous contrast. Multiplanar CT image reconstructions of the cervical spine were also generated. RADIATION DOSE REDUCTION: This exam was performed according to the departmental dose-optimization program which includes automated exposure control, adjustment of the mA and/or kV according to patient size and/or use of iterative reconstruction technique. COMPARISON:  None. FINDINGS: CT HEAD FINDINGS Brain: No evidence of acute infarction, hemorrhage, hydrocephalus, extra-axial collection or mass lesion/mass effect. Vascular: Negative for hyperdense vessel Skull: Negative Sinuses/Orbits: Negative Other: None CT CERVICAL SPINE FINDINGS Alignment: Normal alignment.  Mild dextroscoliosis Skull base and vertebrae: Negative for fracture or mass Soft tissues and spinal canal: Right thyroid mass  4.5 cm in diameter with central low density and a few punctate calcifications. No enlarged lymph nodes in the neck. Disc levels: Severe right foraminal encroachment at C5-6 due to prominent uncinate spurring. Central disc protrusion and spinal stenosis also at C5-6. Moderate foraminal encroachment bilaterally at C6-7 with mild spinal stenosis Upper chest: Lung apices clear. Other: None IMPRESSION: Negative CT head Negative for cervical fracture. Cervical spondylosis with severe right foraminal encroachment at C5-6. Moderate foraminal encroachment bilaterally at C6-7 4.5 cm mass right thyroid. This is increased in size since the CT neck of 05/11/2017 when the nodule measured 20 x 24 x 30 mm. Recommend thyroid ultrasound for further evaluation. (Ref: J Am Coll Radiol. 2015 Feb;12(2): 143-50). Electronically Signed   By: Franchot Gallo M.D.   On: 01/04/2022 12:16   CT Cervical Spine Wo Contrast  Result Date: 01/04/2022 CLINICAL DATA:  Head trauma moderate to severe EXAM: CT HEAD WITHOUT CONTRAST CT CERVICAL SPINE WITHOUT CONTRAST TECHNIQUE: Multidetector CT imaging of the head and cervical spine was performed following the standard protocol without intravenous contrast. Multiplanar CT image reconstructions of the cervical spine were also generated. RADIATION DOSE REDUCTION: This exam was performed according to the departmental dose-optimization program which includes automated exposure control, adjustment of the mA and/or kV according to patient size and/or use of iterative reconstruction technique. COMPARISON:  None. FINDINGS: CT HEAD FINDINGS Brain: No evidence of acute infarction, hemorrhage, hydrocephalus, extra-axial collection or mass lesion/mass effect. Vascular: Negative for hyperdense vessel Skull: Negative Sinuses/Orbits: Negative Other: None CT CERVICAL SPINE FINDINGS Alignment: Normal alignment.  Mild dextroscoliosis Skull base and vertebrae: Negative for fracture or mass Soft tissues and spinal canal:  Right thyroid mass 4.5 cm in diameter with central low density and a few punctate calcifications. No enlarged lymph nodes in the neck. Disc levels: Severe right foraminal encroachment at C5-6 due to prominent uncinate spurring. Central disc protrusion and spinal stenosis also at C5-6. Moderate foraminal encroachment bilaterally at C6-7 with mild spinal stenosis Upper chest: Lung apices clear. Other: None IMPRESSION: Negative CT head Negative for cervical fracture. Cervical spondylosis with severe right foraminal encroachment at C5-6. Moderate foraminal encroachment bilaterally at C6-7 4.5 cm mass right thyroid. This is increased in size since the CT neck of 05/11/2017 when the nodule measured 20 x 24 x 30 mm. Recommend thyroid ultrasound for further evaluation. (Ref: J Am Coll Radiol. 2015 Feb;12(2): 143-50). Electronically Signed   By: Franchot Gallo M.D.   On: 01/04/2022 12:16   DG Chest Portable 1 View  Result Date: 01/04/2022 CLINICAL DATA:  Syncope last night. EXAM:  PORTABLE CHEST 1 VIEW COMPARISON:  07/23/2014 FINDINGS: Normal heart size. High right paratracheal opacity and tracheal deviation correlating with thyroid nodule on in-progress cervical spine CT. There is no edema, consolidation, effusion, or pneumothorax. IMPRESSION: 1. No evidence of acute cardiopulmonary disease. 2. Right apical opacity with tracheal deviation correlating with thyroid nodule by cervical spine CT. Recommend follow-up given the change since prior radiography in 2012 and neck CT in 2018 Electronically Signed   By: Jorje Guild M.D.   On: 01/04/2022 12:20    Cardiac Studies   Echocardiogram and LHC today   Patient Profile     61 y.o. male with CAD s/p stent placement to the LAD, HTN, and tobacco use who is being treated for progressive angina. Undergoing LHC today   Assessment & Plan    Chest pain,?  Progressive Angina  HTN  - Patient had an NSTEMI in 06/2014 and underwent a LHC with DES to the LAD  - Stress test  in 2018 was normal - Patient reports that he has been experiencing chest pain on exertion that has been more frequent over the past few months. Pain is relieved with rest, nitro. Patient has been less active than usual due to these symptoms.   - hsTn 5>>4 - EKG without signs of ischemia  - Echocardiogram pending  - Undergoing LHC later today- consent obtained by Dr. Johney Frame on 3/9 (see H&P)  - Patient has been NPO since midnight  - Continue DAPT with ASA, plavix  - Continue crestor 10 mg daily, zetia 10 mg daily, coreg 3.125 mg daily  - Holding amlodipine as he has lower extremity edema -> would continue to hold given concern for possible orthostatic hypotension related syncope. - Continue irbesartan 150 mg daily - Considering adding imdur or ranexa post cath for angina    Syncope -suspect orthostatic syncope. - Patient had an episode of syncope yesterday, hit his head on a door post  - CT head was negative, CT cervical negative for fracture  - Patient reports frequent episodes dizziness on standing in the past few weeks   - Orthostatic vital signs negative (however cannot be certain that this was done after IV fluids given)  Recommend continue to hold amlodipine and allow for mild permissive hypertension until symptoms stabilize out. - Echocardiogram pending - If LHC is unremarkable, consider long-term monitor at discharge   Thyroid Mass  - 4.5 cm thyroid mass identified on imaging  - TSH within normal limits  - Recommended outpatient follow up   For questions or updates, please contact [facility] Please consult [email or web address] for contact info under        Signed, Margie Billet, PA-C  01/05/2022, 8:16 AM      ATTENDING ATTESTATION  I have seen, examined and evaluated the patient this AM along with Vikki Ports, PA-C.Marland Kitchen  After reviewing all the available data and chart, we discussed the patients laboratory, study & physical findings as well as symptoms in detail.  I agree with her findings, examination as well as impression recommendations as per our discussion.    Attending adjustments noted in italics.   I then saw the patient shortly after his cardiac catheterization revealing widely patent LAD stents with mild disease elsewhere.  Nonobstructive 40% proximal RCA and 30% mid LCx with 20% proximal LAD.  LVEDP normal.  2D echo performed, not yet read - prelim read - normal. ->  Provided formal read is normal, anticipate he should be discharged later on today once he  gets the Zio patch placed.  We will do 14-day Zio patch systems clued ischemia however it does not seem to be the etiology.  Only symptomatic bradycardia or pauses would explain it.    Glenetta Hew, M.D., M.S. Interventional Cardiologist   Pager # [phone] Phone # [phone] [address] [place], [place] [address]

## 2022-01-05 NOTE — Interval H&P Note (Signed)
History and Physical Interval Note: ? ?01/05/2022 ?9:44 AM ? ?Donald Torres  has presented today for surgery, with the diagnosis of unstable angina.  The various methods of treatment have been discussed with the patient and family. After consideration of risks, benefits and other options for treatment, the patient has consented to  Procedure(s): ?LEFT HEART CATH AND CORONARY ANGIOGRAPHY (N/A) as a surgical intervention.  The patient's history has been reviewed, patient examined, no change in status, stable for surgery.  I have reviewed the patient's chart and labs.  Questions were answered to the patient's satisfaction.   ? ?Cath Lab Visit (complete for each Cath Lab visit) ? ?Clinical Evaluation Leading to the Procedure:  ? ?ACS: No. ? ?Non-ACS:   ? ?Anginal Classification: CCS III ? ?Anti-ischemic medical therapy: Maximal Therapy (2 or more classes of medications) ? ?Non-Invasive Test Results: No non-invasive testing performed ? ?Prior CABG: No previous CABG ? ? ? ? ? ? ? ?Donald Torres ? ? ?

## 2022-01-05 NOTE — Discharge Summary (Addendum)
Discharge Summary    [name] MRN: [id number]; DOB: 04-16-1961  Admit date: 01/04/2022 Discharge date: 01/05/2022  PCP:  Alroy Dust, L.Marlou Sa, MD   [place] Providers Cardiologist:  Larae Grooms, MD     Discharge Diagnoses    Principal Problem:   Orthostatic syncope Active Problems:   Chest pain   Unstable angina [facility])   Diagnostic Studies/Procedures    Left Heart Catheterization 01/05/22    Prox RCA lesion is 40% stenosed.   Mid Cx lesion is 30% stenosed.   Prox LAD lesion is 20% stenosed.   Previously placed Mid LAD stent (unknown type) is  widely patent.   Patent mid LAD stent Mild non-obstructive disease in the RCA and Circumflex Normal LV filling pressure.    Recommendations: Continue medical management of CAD. Consider cardiac monitor given syncope on presentation.   Diagnostic Dominance: Right  Echocardiogram 01/05/22   1. Left ventricular ejection fraction, by estimation, is 60 to 65%. The  left ventricle has normal function. The left ventricle has no regional  wall motion abnormalities. There is mild concentric left ventricular  hypertrophy. Left ventricular diastolic  parameters are consistent with Grade I diastolic dysfunction (impaired  relaxation).   2. Right ventricular systolic function is normal. The right ventricular  size is normal.   3. The mitral valve is normal in structure. No evidence of mitral valve  regurgitation. No evidence of mitral stenosis.   4. The aortic valve was not well visualized. Aortic valve regurgitation  is not visualized. No aortic stenosis is present.   5. The inferior vena cava is normal in size with greater than 50%  respiratory variability, suggesting right atrial pressure of 3 mmHg.   Comparison(s): No significant change from prior study.   ___  __________   History of Present Illness     Donald Torres is a 61 y.o. male with CAD s/p stent placement to the LAD, HTN, and tobacco use who  is being seen 01/04/2022 for the evaluation of chest pain.  Donald Torres is a 61 year old male with above medical history who is followed by Dr. Irish Lack. Per chart review, patient was admitted in 06/2014 with an non-STEMI. Underwent left heart cath that showed a 90% lesion in the LAD with mobile thrombus that was treated with aspiration thrombectomy and a DES. Echo at that time showed an EF of 55-60%, and grade I diastolic dysfunction. Later, patient was hospitalized in July 2017 for a dental infection, echocardiogram on 05/12/2017 showed an EF of 54-27%, grade I diastolic dysfunction. Patient was seen in December 2018 for chest pain, underwent a nuclear stress test on 10/24/2017 that was a normal, low risk study. Patient was last seen by cardiology in 2021, patient was doing well at that time and did not have any chest pain.    Patient presented to the ED on 3/9 complaining of a syncopal episode last night causing him to fall and hit his head on the door frame. Reported that he has also been having chest pain for the past few weeks.    Labs in the ED showed Na 139, K 3.2, creatinine 0.84, hemoglobin 12.8, WBC 5.7. hsTn 5>>4. BNP normal at 38.2.    CXR showed no acute cardiopulmonary disease. CT head showed no acute abnormalities. CT cervical spine was negative for fracture, but noted a 4.5 cm mass in the right thyroid.    On interview, patient reported that the night before, he had walked outside to get  his mail and he started to feel light headed once he got inside. He had some mild chest discomfort. His dizziness got worse, but his chest pain remained mild. Denies any palpitations, SOB. Patient remembers waking up on the floor and his wife said that he had hit his head on the door frame. Patient had been having frequent episodes of dizziness upon standing in the past few weeks. He had also noticed having increasing episodes of chest discomfort and shortness of breath on exertion. Chest discomfort only  occurred when he was exerting himself, never at rest. Discomfort was relieved with rest or nitroglycerin. He had planned to see his primary cardiologist (Dr. Irish Lack) in the clinic next week for evaluation. At the time of evaluation, patient was chest pain free and denies dizziness, palpitations, SOB. Had some swelling in his ankles, unchanged. Believes it is caused by his amlodipine.     Hospital Course     Consultants: None  Chest pain, Progressive Angina  HTN  - Patient had an NSTEMI in 06/2014 and underwent a LHC with DES to the LAD  - Stress test in 2018 was normal - Patient reports that he has been experiencing chest pain on exertion that has been more frequent over the past few months. Pain is relieved with rest, nitro. Patient has been less active than usual due to these symptoms.   - hsTn 5>>4 - EKG without signs of ischemia  - Echocardiogram showed EF 00-92%, grade I diastolic dysfunction  - Underwent LHC on 3/10 that showed mild non-obstructive disease in the RCA and circumflex. Previously placed mid LAD stent was widely patent  - Continue DAPT with ASA, plavix  - Continue crestor 10 mg daily, zetia 10 mg daily, coreg 3.125 mg daily  - Discontinue amlodipine as he has lower extremity edema, and there is concern for possible orthostatic hypotension related syncope  - Continue telmisartan 40 mg daily   Syncope  - Patient had an episode of syncope yesterday, hit his head on a door post  - CT head was negative, CT cervical negative for fracture  - Patient reports frequent episodes dizziness on standing in the past few weeks   - Orthostatic vital signs negative (however, may be inaccurate as patient was given IV fluids, will continue to hold amlodipine and will allow for permissive HTN until symptoms stabilize)  - Echocardiogram results above - Ordered 14 day live-telemetry monitor (zio patch) at discharge  - Ordered outpatient carotid ultrasounds, messaged office to arrange  -  Educated patient that he cannot drive for 6 months or until cause of his syncope is determined and corrected   Thyroid Mass  - 4.5 cm thyroid mass identified on imaging  - TSH within normal limits  - Recommended outpatient follow up  Did the patient have an acute coronary syndrome (MI, NSTEMI, STEMI, etc) this admission?:  No                               Did the patient have a percutaneous coronary intervention (stent / angioplasty)?:  No.     Patient was seen and evaluated by Dr. Ellyn Hack and deemed stable for discharge.   Patient has an outpatient follow up appointment with Dr. Irish Lack on 3/16.     _____________  Discharge Vitals Blood pressure 126/76, pulse 80, temperature 98.1 F (36.7 C), temperature source Oral, resp. rate 14, height 6' (1.829 m), weight 104.4 kg, SpO2 96 %.  Filed Weights   01/04/22 1130 01/04/22 1700 01/05/22 0603  Weight: 109.8 kg 105.6 kg 104.4 kg    Labs & Radiologic Studies    CBC Recent Labs    01/04/22 1147 01/04/22 2006  WBC 5.7 6.6  NEUTROABS 3.7  --   HGB 12.8* 12.7*  HCT 39.2 38.9*  MCV 94.7 94.2  PLT 220 833   Basic Metabolic Panel Recent Labs    01/04/22 1147 01/04/22 2006 01/05/22 0042  NA 139  --  139  K 3.2*  --  4.1  CL 102  --  105  CO2 28  --  25  GLUCOSE 112*  --  85  BUN 13  --  11  CREATININE 0.84 0.92 0.95  CALCIUM 9.2  --  8.9   Liver Function Tests No results for input(s): AST, ALT, ALKPHOS, BILITOT, PROT, ALBUMIN in the last 72 hours. No results for input(s): LIPASE, AMYLASE in the last 72 hours. High Sensitivity Troponin:   Recent Labs  Lab 01/04/22 1147 01/04/22 1345  TROPONINIHS 5 4    BNP Invalid input(s): POCBNP D-Dimer No results for input(s): DDIMER in the last 72 hours. Hemoglobin A1C No results for input(s): HGBA1C in the last 72 hours. Fasting Lipid Panel Recent Labs    01/05/22 0042  CHOL 98  HDL 32*  LDLCALC 49  TRIG 84  CHOLHDL 3.1   Thyroid Function Tests Recent Labs     01/05/22 0042  TSH 2.375   _____________  CT Head Wo Contrast  Result Date: 01/04/2022 CLINICAL DATA:  Head trauma moderate to severe EXAM: CT HEAD WITHOUT CONTRAST CT CERVICAL SPINE WITHOUT CONTRAST TECHNIQUE: Multidetector CT imaging of the head and cervical spine was performed following the standard protocol without intravenous contrast. Multiplanar CT image reconstructions of the cervical spine were also generated. RADIATION DOSE REDUCTION: This exam was performed according to the departmental dose-optimization program which includes automated exposure control, adjustment of the mA and/or kV according to patient size and/or use of iterative reconstruction technique. COMPARISON:  None. FINDINGS: CT HEAD FINDINGS Brain: No evidence of acute infarction, hemorrhage, hydrocephalus, extra-axial collection or mass lesion/mass effect. Vascular: Negative for hyperdense vessel Skull: Negative Sinuses/Orbits: Negative Other: None CT CERVICAL SPINE FINDINGS Alignment: Normal alignment.  Mild dextroscoliosis Skull base and vertebrae: Negative for fracture or mass Soft tissues and spinal canal: Right thyroid mass 4.5 cm in diameter with central low density and a few punctate calcifications. No enlarged lymph nodes in the neck. Disc levels: Severe right foraminal encroachment at C5-6 due to prominent uncinate spurring. Central disc protrusion and spinal stenosis also at C5-6. Moderate foraminal encroachment bilaterally at C6-7 with mild spinal stenosis Upper chest: Lung apices clear. Other: None IMPRESSION: Negative CT head Negative for cervical fracture. Cervical spondylosis with severe right foraminal encroachment at C5-6. Moderate foraminal encroachment bilaterally at C6-7 4.5 cm mass right thyroid. This is increased in size since the CT neck of 05/11/2017 when the nodule measured 20 x 24 x 30 mm. Recommend thyroid ultrasound for further evaluation. (Ref: J Am Coll Radiol. 2015 Feb;12(2): 143-50). Electronically  Signed   By: Franchot Gallo M.D.   On: 01/04/2022 12:16   CT Cervical Spine Wo Contrast  Result Date: 01/04/2022 CLINICAL DATA:  Head trauma moderate to severe EXAM: CT HEAD WITHOUT CONTRAST CT CERVICAL SPINE WITHOUT CONTRAST TECHNIQUE: Multidetector CT imaging of the head and cervical spine was performed following the standard protocol without intravenous contrast. Multiplanar CT image reconstructions of the cervical spine were  also generated. RADIATION DOSE REDUCTION: This exam was performed according to the departmental dose-optimization program which includes automated exposure control, adjustment of the mA and/or kV according to patient size and/or use of iterative reconstruction technique. COMPARISON:  None. FINDINGS: CT HEAD FINDINGS Brain: No evidence of acute infarction, hemorrhage, hydrocephalus, extra-axial collection or mass lesion/mass effect. Vascular: Negative for hyperdense vessel Skull: Negative Sinuses/Orbits: Negative Other: None CT CERVICAL SPINE FINDINGS Alignment: Normal alignment.  Mild dextroscoliosis Skull base and vertebrae: Negative for fracture or mass Soft tissues and spinal canal: Right thyroid mass 4.5 cm in diameter with central low density and a few punctate calcifications. No enlarged lymph nodes in the neck. Disc levels: Severe right foraminal encroachment at C5-6 due to prominent uncinate spurring. Central disc protrusion and spinal stenosis also at C5-6. Moderate foraminal encroachment bilaterally at C6-7 with mild spinal stenosis Upper chest: Lung apices clear. Other: None IMPRESSION: Negative CT head Negative for cervical fracture. Cervical spondylosis with severe right foraminal encroachment at C5-6. Moderate foraminal encroachment bilaterally at C6-7 4.5 cm mass right thyroid. This is increased in size since the CT neck of 05/11/2017 when the nodule measured 20 x 24 x 30 mm. Recommend thyroid ultrasound for further evaluation. (Ref: J Am Coll Radiol. 2015 Feb;12(2):  143-50). Electronically Signed   By: Franchot Gallo M.D.   On: 01/04/2022 12:16   CARDIAC CATHETERIZATION  Result Date: 01/05/2022   Prox RCA lesion is 40% stenosed.   Mid Cx lesion is 30% stenosed.   Prox LAD lesion is 20% stenosed.   Previously placed Mid LAD stent (unknown type) is  widely patent. Patent mid LAD stent Mild non-obstructive disease in the RCA and Circumflex Normal LV filling pressure. Recommendations: Continue medical management of CAD. Consider cardiac monitor given syncope on presentation.   DG Chest Portable 1 View  Result Date: 01/04/2022 CLINICAL DATA:  Syncope last night. EXAM: PORTABLE CHEST 1 VIEW COMPARISON:  07/23/2014 FINDINGS: Normal heart size. High right paratracheal opacity and tracheal deviation correlating with thyroid nodule on in-progress cervical spine CT. There is no edema, consolidation, effusion, or pneumothorax. IMPRESSION: 1. No evidence of acute cardiopulmonary disease. 2. Right apical opacity with tracheal deviation correlating with thyroid nodule by cervical spine CT. Recommend follow-up given the change since prior radiography in 2012 and neck CT in 2018 Electronically Signed   By: Jorje Guild M.D.   On: 01/04/2022 12:20   ECHOCARDIOGRAM COMPLETE  Result Date: 01/05/2022    ECHOCARDIOGRAM REPORT   Patient Name:   Donald Torres Date of Exam: 01/05/2022 Medical Rec #:  [id number]       Height:       72.0 in Accession #:    [id number]      Weight:       230.2 lb Date of Birth:  Aug 23, 1961       BSA:          2.262 m Patient Age:    34 years        BP:           147/80 mmHg Patient Gender: M               HR:           78 bpm. Exam Location:  Inpatient Procedure: 2D Echo Indications:    syncope. chest pain.  History:        Patient has prior history of Echocardiogram examinations, most  recent 05/12/2017. CAD; Risk Factors:Current Smoker, Hypertension                 and Dyslipidemia.  Sonographer:    Johny Chess RDCS Referring Phys: [phone]  [place]  Sonographer Comments: Technically difficult study due to poor echo windows. Image acquisition challenging due to respiratory motion. IMPRESSIONS  1. Left ventricular ejection fraction, by estimation, is 60 to 65%. The left ventricle has normal function. The left ventricle has no regional wall motion abnormalities. There is mild concentric left ventricular hypertrophy. Left ventricular diastolic parameters are consistent with Grade I diastolic dysfunction (impaired relaxation).  2. Right ventricular systolic function is normal. The right ventricular size is normal.  3. The mitral valve is normal in structure. No evidence of mitral valve regurgitation. No evidence of mitral stenosis.  4. The aortic valve was not well visualized. Aortic valve regurgitation is not visualized. No aortic stenosis is present.  5. The inferior vena cava is normal in size with greater than 50% respiratory variability, suggesting right atrial pressure of 3 mmHg. Comparison(s): No significant change from prior study. FINDINGS  Left Ventricle: Left ventricular ejection fraction, by estimation, is 60 to 65%. The left ventricle has normal function. The left ventricle has no regional wall motion abnormalities. The left ventricular internal cavity size was normal in size. There is  mild concentric left ventricular hypertrophy. Left ventricular diastolic parameters are consistent with Grade I diastolic dysfunction (impaired relaxation). Right Ventricle: The right ventricular size is normal. No increase in right ventricular wall thickness. Right ventricular systolic function is normal. Left Atrium: Left atrial size was normal in size. Right Atrium: Right atrial size was normal in size. Pericardium: There is no evidence of pericardial effusion. Presence of epicardial fat layer. Mitral Valve: The mitral valve is normal in structure. No evidence of mitral valve regurgitation. No evidence of mitral valve stenosis. Tricuspid  Valve: The tricuspid valve is normal in structure. Tricuspid valve regurgitation is not demonstrated. No evidence of tricuspid stenosis. Aortic Valve: The aortic valve was not well visualized. Aortic valve regurgitation is not visualized. No aortic stenosis is present. Pulmonic Valve: The pulmonic valve was normal in structure. Pulmonic valve regurgitation is not visualized. No evidence of pulmonic stenosis. Aorta: The aortic root and ascending aorta are structurally normal, with no evidence of dilitation. Venous: The inferior vena cava is normal in size with greater than 50% respiratory variability, suggesting right atrial pressure of 3 mmHg. IAS/Shunts: No atrial level shunt detected by color flow Doppler.  LEFT VENTRICLE PLAX 2D LVIDd:         4.10 cm   Diastology LVIDs:         2.60 cm   LV e' medial:    7.58 cm/s LV PW:         1.20 cm   LV E/e' medial:  8.2 LV IVS:        1.20 cm   LV e' lateral:   8.10 cm/s LVOT diam:     1.90 cm   LV E/e' lateral: 7.6 LV SV:         83 LV SV Index:   36 LVOT Area:     2.84 cm  RIGHT VENTRICLE             IVC RV S prime:     11.00 cm/s  IVC diam: 1.70 cm TAPSE (M-mode): 2.2 cm LEFT ATRIUM             Index  RIGHT ATRIUM           Index LA diam:        4.20 cm 1.86 cm/m   RA Area:     18.20 cm LA Vol (A2C):   68.4 ml 30.24 ml/m  RA Volume:   50.90 ml  22.51 ml/m LA Vol (A4C):   47.1 ml 20.83 ml/m LA Biplane Vol: 56.8 ml 25.11 ml/m  AORTIC VALVE LVOT Vmax:   145.00 cm/s LVOT Vmean:  97.900 cm/s LVOT VTI:    0.291 m  AORTA Ao Root diam: 3.80 cm Ao Asc diam:  3.00 cm MITRAL VALVE MV Area (PHT): 3.37 cm    SHUNTS MV Decel Time: 225 msec    Systemic VTI:  0.29 m MV E velocity: 61.80 cm/s  Systemic Diam: 1.90 cm MV A velocity: 70.20 cm/s MV E/A ratio:  0.88 Rudean Haskell MD Electronically signed by Rudean Haskell MD Signature Date/Time: 01/05/2022/11:02:24 AM    Final    Disposition   Pt is being discharged home today in good condition.  Follow-up  Plans & Appointments     Follow-up Information     Jettie Booze, MD Follow up on 01/11/2022.   Specialties: Cardiology, Radiology, Interventional Cardiology Why: Appointment at 10 AM Contact information: [address] [place] [address] [phone]                Discharge Instructions     Diet - low sodium heart healthy   Complete by: As directed    Discharge instructions   Complete by: As directed    Radial Site Care Refer to this sheet in the next few weeks. These instructions provide you with information on caring for yourself after your procedure. Your caregiver may also give you more specific instructions. Your treatment has been planned according to current medical practices, but problems sometimes occur. Call your caregiver if you have any problems or questions after your procedure. HOME CARE INSTRUCTIONS You may shower the day after the procedure. Remove the bandage (dressing) and gently wash the site with plain soap and water. Gently pat the site dry.  Do not apply powder or lotion to the site.  Do not submerge the affected site in water for 3 to 5 days.  Inspect the site at least twice daily.  Do not flex or bend the affected arm for 24 hours.  No lifting over 5 pounds (2.3 kg) for 5 days after your procedure.  Do not drive home if you are discharged the same day of the procedure. Have someone else drive you.  You may drive 24 hours after the procedure unless otherwise instructed by your caregiver.  What to expect: Any bruising will usually fade within 1 to 2 weeks.  Blood that collects in the tissue (hematoma) may be painful to the touch. It should usually decrease in size and tenderness within 1 to 2 weeks.  SEEK IMMEDIATE MEDICAL CARE IF: You have unusual pain at the radial site.  You have redness, warmth, swelling, or pain at the radial site.  You have drainage (other than a small amount of blood on the dressing).  You have  chills.  You have a fever or persistent symptoms for more than 72 hours.  You have a fever and your symptoms suddenly get worse.  Your arm becomes pale, cool, tingly, or numb.  You have heavy bleeding from the site. Hold pressure on the site.   Increase activity slowly   Complete  by: As directed        Discharge Medications   Allergies as of 01/05/2022       Reactions   Ciprofloxacin Rash   Flagyl [metronidazole] Rash        Medication List     STOP taking these medications    amLODipine 5 MG tablet Commonly known as: NORVASC   hydrochlorothiazide 25 MG tablet Commonly known as: HYDRODIURIL       TAKE these medications    aspirin 81 MG EC tablet Take 1 tablet (81 mg total) by mouth daily. Swallow whole. Start taking on: January 06, 2022   carvedilol 3.125 MG tablet Commonly known as: COREG TAKE 1 TABLET BY MOUTH TWO TIMES DAILY What changed: when to take this   clopidogrel 75 MG tablet Commonly known as: PLAVIX Take 1 tablet (75 mg total) by mouth daily. Please make overdue appt with Dr. Irish Lack before anymore refills. 2nd attempt What changed: additional instructions   dapsone 100 MG tablet Take 50 mg by mouth daily.   ezetimibe 10 MG tablet Commonly known as: ZETIA Take 10 mg by mouth daily.   FISH OIL PO Take 1,000 mg by mouth daily.   nitroGLYCERIN 0.4 MG SL tablet Commonly known as: Nitrostat Place 1 tablet (0.4 mg total) under the tongue every 5 (five) minutes x 3 doses as needed. For chest pain   rosuvastatin 10 MG tablet Commonly known as: CRESTOR Take 1 tablet (10 mg total) by mouth daily.   telmisartan 40 MG tablet Commonly known as: MICARDIS Take 40 mg by mouth daily.           Outstanding Labs/Studies   14 day Zio patch  Bilateral carotid ultrasound   Duration of Discharge Encounter   Greater than 30 minutes including physician time.  Signed, Margie Billet, PA-C 01/05/2022, 1:09 PM  Patient was seen and  examined both before and after his heart catheterization.  Heart catheterization showed widely patent stents but otherwise relatively minimal CAD.  Chest pain not likely ACS related.  Probably more musculoskeletal in nature.  His real presenting symptom was actually the syncopal episode which sounds to be most consistent with orthostatic syncope.  Recommendation is to hold amlodipine and aggressive hydration.  To exclude arrhythmia, and we will have him wear a 14-day ZIO    Glenetta Hew, MD 01/05/2022, 6:12 PM

## 2022-01-05 NOTE — Progress Notes (Signed)
Discharge instructions reviewed and questions answered. Patient and spouse verbalize understanding. Patient via wheelchair in stable condition to waiting car. ?

## 2022-01-09 NOTE — Progress Notes (Signed)
?  ?Cardiology Office Note ? ? ?Date:  01/11/2022  ? ?ID:  Donald Torres, DOB October 14, 1961, MRN [id number] ? ?PCP:  Alroy Dust, L.Marlou Sa, MD  ? ? ?No chief complaint on file. ? ?CAD ? ?Wt Readings from Last 3 Encounters:  ?01/11/22 239 lb (108.4 kg)  ?01/05/22 230 lb 2.6 oz (104.4 kg)  ?09/12/20 226 lb 3.2 oz (102.6 kg)  ?  ? ?  ?History of Present Illness: ?Donald Torres is a 61 y.o. male  with a history of CAD, HTN and tobacco abuse. He was admitted 06/2014 with a non-STEMI. LHC demonstrated a 90% lesion in the LAD with mobile thrombus that was treated with aspiration thrombectomy and a DES.  EF was normal.  Angina was a bilateral arm fatigue.  ?  ?His wife, Margarita Grizzle works at Dr[employer] office. ?  ?He had leg pains that were attributed to lipitor.    He continued to have pains in his legs. The cholesterol had gone up so he went back on pravastatin. ?  ?He has done well for several years since the stent in 2015. ?  ?He was hospitalized in 2018, July with a dental infection and abscess. ?  ?In 2021, he had bilateral femoral hernias repaired.  ? ?He had syncope in 2023. Cath was done: ?"Left Heart Catheterization 01/05/22  ?  Prox RCA lesion is 40% stenosed. ?  Mid Cx lesion is 30% stenosed. ?  Prox LAD lesion is 20% stenosed. ?  Previously placed Mid LAD stent (unknown type) is  widely patent. ?  ?Patent mid LAD stent ?Mild non-obstructive disease in the RCA and Circumflex ?Normal LV filling pressure.  ?  ?Recommendations: Continue medical management of CAD. Consider cardiac monitor given syncope on presentation. " ? ?Got dizzy when he stood up.  FElt better but then it returned while walking.  He fell and hit his head on the door.  He does not remember falling. He thinks maybe he had passed out for just a few seconds.  He put some ice on his head.  ? ? He felt well the next day.  He went to work.  At work, he felt poorly.  He went to [facility], wet to [facility].  Had cath as noted above.  No dizzy spells since.   ? ?He has  given up sodas.  He drinks water during the day.   ? ?Blood pressure medicines have been changed prior to his passing out spell.  Amlodipine was stopped. ? ? ?Past Medical History:  ?Diagnosis Date  ? CAD (coronary artery disease)   ? a. 06/2014 NSTEMI/PCI: LM nl, LAD 60mw/ mobile thrombus (aspiration thrombectomy & 2.75x28 Promus DES), LCX mild dzs mid, RCA mild dzs. EF 60%;  b. 09/2017 MV: EF 69%, no ischemia/infarct.  ? High cholesterol   ? Hx of echocardiogram   ? Echo (9/15):  EF 55-60%, no RWMA, Gr 1 DD, mild BAE  ? Hypertension   ? ? ?Past Surgical History:  ?Procedure Laterality Date  ? HERNIA REPAIR    ? LEFT HEART CATH AND CORONARY ANGIOGRAPHY N/A 01/05/2022  ? Procedure: LEFT HEART CATH AND CORONARY ANGIOGRAPHY;  Surgeon: MBurnell Blanks MD;  [facility];  Service: Cardiovascular;  Laterality: N/A;  ? LEFT HEART CATHETERIZATION WITH CORONARY ANGIOGRAM N/A 07/26/2014  ? Procedure: LEFT HEART CATHETERIZATION WITH CORONARY ANGIOGRAM;  Surgeon: JJettie Booze MD;  [facility];  Service: Cardiovascular;  Laterality: N/A;  ? ? ? ?Current  Outpatient Medications  ?Medication Sig Dispense Refill  ? aspirin EC 81 MG EC tablet Take 1 tablet (81 mg total) by mouth daily. Swallow whole.    ? carvedilol (COREG) 3.125 MG tablet TAKE 1 TABLET BY MOUTH TWO TIMES DAILY 180 tablet 2  ? carvedilol (COREG) 3.125 MG tablet Take 6.25 mg by mouth 2 (two) times daily with a meal.    ? dapsone 100 MG tablet Take 50 mg by mouth daily.    ? ezetimibe (ZETIA) 10 MG tablet Take 10 mg by mouth daily.    ? nitroGLYCERIN (NITROSTAT) 0.4 MG SL tablet Place 1 tablet (0.4 mg total) under the tongue every 5 (five) minutes x 3 doses as needed. For chest pain 25 tablet 0  ? Omega-3 Fatty Acids (FISH OIL PO) Take 1,000 mg by mouth daily.     ? rosuvastatin (CRESTOR) 10 MG tablet Take 1 tablet (10 mg total) by mouth daily. 90 tablet 3  ? telmisartan (MICARDIS) 40 MG tablet Take 40 mg by mouth daily.     ? ?No current facility-administered medications for this visit.  ? ? ?Allergies:   Ciprofloxacin and Flagyl [metronidazole]  ? ? ?Social History:  The patient  reports that he quit smoking about 7 years ago. His smoking use included cigarettes. He has a 36.00 pack-year smoking history. He has never used smokeless tobacco. He reports current alcohol use. He reports that he does not use drugs.  ? ?Family History:  The patient's family history includes Cancer in his mother; Diabetes in his brother; Heart attack in his brother, father, and unknown relative; Hypertension in his father and mother.  ? ? ?ROS:  Please see the history of present illness.   Otherwise, review of systems are positive for syncope as noted above.   All other systems are reviewed and negative.  ? ? ?PHYSICAL EXAM: ?VS:  BP (!) 144/82   Pulse 81   Ht 6' (1.829 m)   Wt 239 lb (108.4 kg)   SpO2 97%   BMI 32.41 kg/m?  , BMI Body mass index is 32.41 kg/m?. ?GEN: Well nourished, well developed, in no acute distress ?HEENT: normal ?Neck: no JVD, carotid bruits, or masses ?Cardiac: RRR; no murmurs, rubs, or gallops,no edema  ?Respiratory:  clear to auscultation bilaterally, normal work of breathing ?GI: soft, nontender, nondistended, + BS ?MS: no deformity or atrophy ?Skin: warm and dry, no rash ?Neuro:  Strength and sensation are intact ?Psych: euthymic mood, full affect ? ? ?EKG:   ?The ekg ordered today demonstrates NSR, no ST changes ? ? ?Recent Labs: ?01/04/2022: B Natriuretic Peptide 38.2; Hemoglobin 12.7; Platelets 201 ?01/05/2022: BUN 11; Creatinine, Ser 0.95; Potassium 4.1; Sodium 139; TSH 2.375  ? ?Lipid Panel ?   ?Component Value Date/Time  ? CHOL 98 01/05/2022 0042  ? CHOL 104 08/05/2018 0801  ? TRIG 84 01/05/2022 0042  ? HDL 32 (L) 01/05/2022 0042  ? HDL 44 08/05/2018 0801  ? CHOLHDL 3.1 01/05/2022 0042  ? VLDL 17 01/05/2022 0042  ? [place] 49 01/05/2022 0042  ? [place] 46 08/05/2018 0801  ? ?  ?Other studies Reviewed: ?Additional studies/  records that were reviewed today with results demonstrating: hospital records and labs reviewed. ? ? ?ASSESSMENT AND PLAN: ? ?CAD/Old MI: REcent cath showed patent stent. No angina on medical therapy since the cath.  He had been having some DOE and CP on exertion prior to the cath.  ?Hyperlipidemia: Continue Crestor. LDL 49.  Whole food, plant-based diet.  He is trying to avoid gluten.  Avoid processed foods. ?HTN: Amlodipine was stopped.  HCTZ was stopped.  Home BP readings have been increasing.  SOme readings in the 140s.  Avoid excessive salt.  Increase carvedilol to 12.5 mg p.o. twice daily.  Heart rate is in the 70s.  Blood pressure on my recheck today was 178/80.  His wrist cuff was showing 184/80. ?Syncope: No dizziness.  Wearing heart monitor.  Stay well hydrated.  Echo showed normal left ventricular function, normal valvular function.  Awaiting full results of Zio patch.  I suspect his syncope was related to change in blood pressure medicines along with potentially some dehydration.  Cardiac arrhythmia seems unlikely.  No issues with the first week of the Zio patch.  Echo and cath have been reassuring.  Continue aggressive secondary prevention for his vascular disease.  I think he can go back to driving. CArotid DOppler pending as well.  ?Gluten intolerance: treated with Dapsone.  Been taking for over a year. ? ? ? ?Current medicines are reviewed at length with the patient today.  The patient concerns regarding his medicines were addressed. ? ?The following changes have been made:  increase carvedilol to 12.5 mg BID.   ? ?Labs/ tests ordered today include:  ?No orders of the defined types were placed in this encounter. ? ? ?Recommend 150 minutes/week of aerobic exercise ?Low fat, low carb, high fiber diet recommended ? ?Disposition:   FU in 2-3 weeks in Pharm D HTN clinic ? ? ?Signed, ?Larae Grooms, MD  ?01/11/2022 10:09 AM    ?[place] ?[address], [place], [place]   [address] ?Phone: [phone]; Fax: [phone]  ? ?

## 2022-01-11 ENCOUNTER — Other Ambulatory Visit: Payer: Self-pay

## 2022-01-11 ENCOUNTER — Ambulatory Visit (HOSPITAL_COMMUNITY)
Admission: RE | Admit: 2022-01-11 | Discharge: 2022-01-11 | Disposition: A | Discharge status: Home / Self Care | Payer: 59 | Source: Ambulatory Visit | Attending: Cardiology | Admitting: Cardiology

## 2022-01-11 ENCOUNTER — Ambulatory Visit (INDEPENDENT_AMBULATORY_CARE_PROVIDER_SITE_OTHER): Payer: 59 | Admitting: Interventional Cardiology

## 2022-01-11 ENCOUNTER — Encounter: Payer: Self-pay | Admitting: Interventional Cardiology

## 2022-01-11 VITALS — BP 144/82 | HR 81 | Ht 72.0 in | Wt 239.0 lb

## 2022-01-11 DIAGNOSIS — R55 Syncope and collapse: Secondary | ICD-10-CM

## 2022-01-11 DIAGNOSIS — E782 Mixed hyperlipidemia: Secondary | ICD-10-CM | POA: Diagnosis not present

## 2022-01-11 DIAGNOSIS — K9041 Non-celiac gluten sensitivity: Secondary | ICD-10-CM

## 2022-01-11 DIAGNOSIS — I25118 Atherosclerotic heart disease of native coronary artery with other forms of angina pectoris: Secondary | ICD-10-CM | POA: Diagnosis not present

## 2022-01-11 DIAGNOSIS — I10 Essential (primary) hypertension: Secondary | ICD-10-CM

## 2022-01-11 MED ORDER — CARVEDILOL 12.5 MG PO TABS: 12.5000 mg | ORAL_TABLET | Freq: Two times a day (BID) | ORAL | 3 refills | Status: DC

## 2022-01-11 NOTE — Patient Instructions (Signed)
Medication Instructions:  ?Your physician has recommended you make the following change in your medication: Increase Carvedilol to 12.5 mg by mouth twice daily ? ?*If you need a refill on your cardiac medications before your next appointment, please call your pharmacy* ? ? ?Lab Work: ?none ?If you have labs (blood work) drawn today and your tests are completely normal, you will receive your results only by: ?MyChart Message (if you have MyChart) OR ?A paper copy in the mail ?If you have any lab test that is abnormal or we need to change your treatment, we will call you to review the results. ? ? ?Testing/Procedures: ?none ? ? ?Follow-Up: ?At [facility], you and your health needs are our priority.  As part of our continuing mission to provide you with exceptional heart care, we have created designated Provider Care Teams.  These Care Teams include your primary Cardiologist (physician) and Advanced Practice Providers (APPs -  Physician Assistants and Nurse Practitioners) who all work together to provide you with the care you need, when you need it. ? ?We recommend signing up for the patient portal called "MyChart".  Sign up information is provided on this After Visit Summary.  MyChart is used to connect with patients for Virtual Visits (Telemedicine).  Patients are able to view lab/test results, encounter notes, upcoming appointments, etc.  Non-urgent messages can be sent to your provider as well.   ?To learn more about what you can do with MyChart, go to [email or web address].   ? ?Your next appointment:   ?12 month(s) ? ?The format for your next appointment:   ?In Person ? ?Provider:   ?Larae Grooms, MD   ? ? ?Other Instructions ?You have been referred to Hypertension clinic in our office.  Please schedule new patient appointment with pharmacist in 2-3 weeks.  ? ? ?

## 2022-01-12 ENCOUNTER — Other Ambulatory Visit: Payer: Self-pay | Admitting: Family Medicine

## 2022-01-12 DIAGNOSIS — E079 Disorder of thyroid, unspecified: Secondary | ICD-10-CM

## 2022-01-18 ENCOUNTER — Ambulatory Visit
Admission: RE | Admit: 2022-01-18 | Discharge: 2022-01-18 | Disposition: A | Discharge status: Home / Self Care | Payer: 59 | Source: Ambulatory Visit | Attending: Family Medicine | Admitting: Family Medicine

## 2022-01-18 DIAGNOSIS — E079 Disorder of thyroid, unspecified: Secondary | ICD-10-CM

## 2022-01-26 ENCOUNTER — Telehealth: Payer: Self-pay | Admitting: Interventional Cardiology

## 2022-01-26 ENCOUNTER — Other Ambulatory Visit: Payer: Self-pay | Admitting: Family Medicine

## 2022-01-26 DIAGNOSIS — E041 Nontoxic single thyroid nodule: Secondary | ICD-10-CM

## 2022-01-26 NOTE — Telephone Encounter (Signed)
I spoke with patient and reviewed monitor results with him.  

## 2022-01-26 NOTE — Telephone Encounter (Signed)
Pt returning a phone call... please advise ?

## 2022-02-06 NOTE — Progress Notes (Signed)
[name]                 DOB: 07-Jul-1961                      MRN: [id number] ? ? ? ? ?HPI: ?Donald Torres is a 61 y.o. male referred by Dr. Irish Lack to HTN clinic. PMH is significant for NSTEMI s/p DES to mid-LAD (90% occlusion) and mobile thrombus treated with aspiration thrombectomy in 06/2014, HTN, syncope, and tobacco abuse. Echo at the time showed EF of 55-60% and G1DD which improved to 65-70% in 04/2017. Seen in 09/2017 for chest pain, underwent nuclear stress test that was normal, low risk study. Last seen by cardiology in 2021 and doing well at the time.  ? ?Pt went to ED on 01/04/22 for syncopal episode causing him to fall and hit his head. Also reported he had increasing episodes of chest discomfort and SOB on exertion over past few weeks but was relieved with rest or nitroglycerin. Underwent LHC on 3/10 that showed mid-LAD stent was widely patent, prox-RCA 40% stenosed, mid-Cx 30% stenosed, and prox-LAD 20% stenosed. Echo at the time showed EF of 60-65% and G1DD. Amlodipine and HCTZ were held at discharge. Pt did report swelling in his legs and was concerned it could be d/t amlodipine. Orthostatic vitals were negative, but noted to potentially be inaccurate since pt was given IV fluids. Pt was continued on home carvedilol and telmisartan. Wore a Zio patch for 14 days which showed no cause of syncope. ? ?Pt saw Dr. Irish Lack on 01/11/22. Home BP had been increasing, some in the SBP 140s. BP at visit elevated to 178/80, pt's wrist cuff was showing 184/80. Carvedilol was increased to 12.5 mg BID. ? ?Pt presents today for HTN clinic appointment. Pt reports med compliance. Pt takes his meds at 3:30 am each morning and checks his home BP during the day. Did take his BP meds early this morning. Denies blurred vision and HA. Still having moments of dizziness and lightheadedness several times per day. Notices it when he is standing still. It is not as bad as before when he passed out. Did notice some  dizziness when he would go from bending over to standing but is not experiencing that anymore. Felt like this all started when his carvedilol was doubled. Had LEE for over 1 year which resolved after stopping his amlodipine. Purchased a new BP machine and brought it along with home readings to visit today. Home cuff is measuring accurately compared to clinic reading. ? ?Home BP readings: 163/88, 131/77, 138/72, 142/76, 131/66, 147/71, 139/68, 158/76, 152/74, 151/83, 156/82, 147/77, 151/79 ?Home BP cuff reading in clinic: 204/102, 206/98, HR 63 ?Clinic reading: 202/100 ? ?Current HTN meds: carvedilol 12.'5mg'$  BID, telmisartan '40mg'$  daily ?Previously tried: amlodipine 5 mg daily (LE edema), HCTZ 25 mg daily (low K) ?BP goal: <130/80  ? ?Family History: The patient's family history includes Cancer in his mother; Diabetes in his brother; Heart attack in his brother, father, and unknown relative; Hypertension in his father and mother.  ? ?Social History: Patient reports he quit smoking about 7 years ago. His smoking use included cigarettes. He has a 36.00 pack-year smoking history. Never used smokeless tobacco. Reports current alcohol use and no illegal drug use. ? ?Diet: Drinks coffee in morning (2-4 cups, caffeine), no sodas. Does not eat breakfast. Lunch - whatever leftovers he has, apples/bananas. Dinner - varies depending on the day. Tries to stay  away from fast food (eats it ~3x week); does not add extra salt to food.  ? ?Exercise: Lower back pain - used to walk every day. Walks 5-7 miles at work.  ? ?Wt Readings from Last 3 Encounters:  ?01/11/22 239 lb (108.4 kg)  ?01/05/22 230 lb 2.6 oz (104.4 kg)  ?09/12/20 226 lb 3.2 oz (102.6 kg)  ? ?BP Readings from Last 3 Encounters:  ?01/11/22 (!) 144/82  ?01/05/22 126/76  ?09/12/20 (!) 168/88  ? ?Pulse Readings from Last 3 Encounters:  ?01/11/22 81  ?01/05/22 80  ?09/12/20 81  ? ? ?Renal function: ?CrCl cannot be calculated (Patient's most recent lab result is older than the  maximum 21 days allowed.). ? ?Past Medical History:  ?Diagnosis Date  ? CAD (coronary artery disease)   ? a. 06/2014 NSTEMI/PCI: LM nl, LAD 36mw/ mobile thrombus (aspiration thrombectomy & 2.75x28 Promus DES), LCX mild dzs mid, RCA mild dzs. EF 60%;  b. 09/2017 MV: EF 69%, no ischemia/infarct.  ? High cholesterol   ? Hx of echocardiogram   ? Echo (9/15):  EF 55-60%, no RWMA, Gr 1 DD, mild BAE  ? Hypertension   ? ? ?Current Outpatient Medications on File Prior to Visit  ?Medication Sig Dispense Refill  ? aspirin EC 81 MG EC tablet Take 1 tablet (81 mg total) by mouth daily. Swallow whole.    ? carvedilol (COREG) 12.5 MG tablet Take 1 tablet (12.5 mg total) by mouth 2 (two) times daily. 180 tablet 3  ? dapsone 100 MG tablet Take 50 mg by mouth daily.    ? ezetimibe (ZETIA) 10 MG tablet Take 10 mg by mouth daily.    ? nitroGLYCERIN (NITROSTAT) 0.4 MG SL tablet Place 1 tablet (0.4 mg total) under the tongue every 5 (five) minutes x 3 doses as needed. For chest pain 25 tablet 0  ? Omega-3 Fatty Acids (FISH OIL PO) Take 1,000 mg by mouth daily.     ? rosuvastatin (CRESTOR) 10 MG tablet Take 1 tablet (10 mg total) by mouth daily. 90 tablet 3  ? telmisartan (MICARDIS) 40 MG tablet Take 40 mg by mouth daily.    ? ?No current facility-administered medications on file prior to visit.  ? ? ?Allergies  ?Allergen Reactions  ? Ciprofloxacin Rash  ? Flagyl [Metronidazole] Rash  ? ? ?Assessment/Plan: ? ?1. Hypertension - BP 202/100 very elevated in clinic, pt reports white coat HTN. Home BP readings averaging around 140s-150s/70s which is above goal < 130/80. Home BP cuff reading accurately compared to clinic cuff. Will increase telmisartan to 80 mg daily and start HCTZ 12.5 mg daily. Continue carvedilol 12.5 mg BID. Continue to monitor BP and symptoms at home. Zio patch showed no cause for syncope. Recommended pt to f/u with his PCP to determine other causes for ongoing dizziness/lightheadedness. Will f/u BP and BMET in 3 weeks  after increasing telmisartan and starting HCTZ.  ? ?Patient seen with SDebria Garret PJuabpharmacy student ? ?Megan E. Supple, PharmD, BCACP, CPP ?[id number] [place] [address]?Phone: [phone] Fax: [phone]?02/07/2022 12:53 PM ? ? ?

## 2022-02-07 ENCOUNTER — Ambulatory Visit (INDEPENDENT_AMBULATORY_CARE_PROVIDER_SITE_OTHER): Payer: 59 | Admitting: Pharmacist

## 2022-02-07 VITALS — BP 202/100 | HR 74

## 2022-02-07 DIAGNOSIS — I10 Essential (primary) hypertension: Secondary | ICD-10-CM | POA: Diagnosis not present

## 2022-02-07 MED ORDER — HYDROCHLOROTHIAZIDE 12.5 MG PO CAPS: 12.5000 mg | ORAL_CAPSULE | Freq: Every day | ORAL | 5 refills | Status: AC

## 2022-02-07 MED ORDER — TELMISARTAN 80 MG PO TABS: 80.0000 mg | ORAL_TABLET | Freq: Every day | ORAL | 3 refills | Status: AC

## 2022-02-07 NOTE — Patient Instructions (Signed)
Your blood pressure goal is < 130/80 ? ?Try to limit sodium to < 1500 mg daily ? ?Increase telmisartan to 80 mg daily (can take two 40 mg tablets until you run out) and start hydrochlorothiazide 12.5 mg one tablet daily. Continue other meds. ? ?Will follow-up in 3 weeks for blood pressure check and lab work ? ? ? ?

## 2022-02-08 ENCOUNTER — Ambulatory Visit: Payer: 59

## 2022-02-09 ENCOUNTER — Ambulatory Visit
Admission: RE | Admit: 2022-02-09 | Discharge: 2022-02-09 | Disposition: A | Discharge status: Home / Self Care | Payer: 59 | Source: Ambulatory Visit | Attending: Family Medicine | Admitting: Family Medicine

## 2022-02-09 ENCOUNTER — Other Ambulatory Visit (HOSPITAL_COMMUNITY)
Admission: RE | Admit: 2022-02-09 | Discharge: 2022-02-09 | Disposition: A | Discharge status: Home / Self Care | Payer: 59 | Source: Ambulatory Visit | Attending: Interventional Radiology | Admitting: Interventional Radiology

## 2022-02-09 DIAGNOSIS — E041 Nontoxic single thyroid nodule: Secondary | ICD-10-CM

## 2022-02-12 LAB — CYTOLOGY - NON PAP

## 2022-02-28 NOTE — Progress Notes (Signed)
[name]                 DOB: [birth year]                      MRN: [id number] ? ? ? ? ?HPI: ?Donald Torres is a 61 y.o. male referred by Donald Torres to HTN clinic. PMH is significant for NSTEMI s/p DES to mid-LAD (90% occlusion) and mobile thrombus treated with aspiration thrombectomy in 06/2014, HTN, syncope, and tobacco abuse. Echo at the time showed EF of 55-60% and G1DD which improved to 65-70% in 04/2017. Seen in 09/2017 for chest pain, underwent nuclear stress test that was normal, low risk study. Last seen by cardiology in 2021 and doing well at the time.  ? ?Pt went to ED on 01/04/22 for syncopal episode causing him to fall and hit his head. Also reported he had increasing episodes of chest discomfort and SOB on exertion over past few weeks but was relieved with rest or nitroglycerin. Underwent LHC on 3/10 that showed mid-LAD stent was widely patent, prox-RCA 40% stenosed, mid-Cx 30% stenosed, and prox-LAD 20% stenosed. Echo at the time showed EF of 60-65% and G1DD. Amlodipine and HCTZ were held at discharge. Pt did report swelling in his legs and was concerned it could be d/t amlodipine. Orthostatic vitals were negative, but noted to potentially be inaccurate since pt was given IV fluids. Pt was continued on home carvedilol and telmisartan. Wore a Zio patch for 14 days which showed no cause of syncope. ? ?Pt saw Donald Torres on 01/11/22. Home BP had been increasing, some in the SBP 140s. BP at visit elevated to 178/80, pt's wrist cuff was showing 184/80. Carvedilol was increased to 12.5 mg BID. ? ?I saw pt on 4/12 where BP was very elevated in clinic at 202/100. Pt reported white coat HTN with home BP readings 140s-150s/70s, and his home BP cuff was accurate. Telmisartan was increased to '80mg'$  daily and HCTZ 12.'5mg'$  daily was started. Zio patch showed no cause for syncope. ? ?Pt presents today for follow up. Reports tolerating his meds well. Has had a few episodes of dizziness - usually after  standing, then feels dizzy for a few seconds after he's been walking for 20 steps or so. Has been going on for a few years. Not worse after med changes. No falls, denies headache, blurred vision, and LE edema. Wearing compression stockings. Staying well hydrated with water. Hasn't checked BP when he's feeling dizzy. Reports BP occasionally higher in the AM on his days off (takes AM meds around 7am instead of 3:30 those days). Walking more. Has taken his meds this AM, had 2 cups of coffee so far. ? ?Current HTN meds: carvedilol 12.'5mg'$  BID (3:30am, 6pm), telmisartan '80mg'$  daily (3:30am), HCTZ 12.'5mg'$  daily (3:30am) ?Previously tried: amlodipine 5 mg daily (LE edema), HCTZ 25 mg daily (low K) ?BP goal: <130/80mHg ? ?Family History: The patient's family history includes Cancer in his mother; Diabetes in his brother; Heart attack in his brother, father, and unknown relative; Hypertension in his father and mother.  ? ?Social History: Patient reports he quit smoking about 7 years ago. His smoking use included cigarettes. He has a 36.00 pack-year smoking history. Never used smokeless tobacco. Reports current alcohol use and no illegal drug use. ? ?Diet: Drinks coffee in morning (2-4 cups, caffeine), no sodas. Does not eat breakfast. Lunch - whatever leftovers he has, apples/bananas. Dinner - varies depending on the day.  Tries to stay away from fast food (eats it ~3x week); does not add extra salt to food.  ? ?Exercise: Lower back pain - used to walk every day. Walks 5-7 miles at work.  ? ?Home BP readings: 139/72, 112/66, 153/77, 136/78, 167/86, 135/68, HR 72, 64, 76, 81, 75, 82, 70 ? ?Wt Readings from Last 3 Encounters:  ?01/11/22 239 lb (108.4 kg)  ?01/05/22 230 lb 2.6 oz (104.4 kg)  ?09/12/20 226 lb 3.2 oz (102.6 kg)  ? ?BP Readings from Last 3 Encounters:  ?02/07/22 (!) 202/100  ?01/11/22 (!) 144/82  ?01/05/22 126/76  ? ?Pulse Readings from Last 3 Encounters:  ?02/07/22 74  ?01/11/22 81  ?01/05/22 80  ? ? ?Renal  function: ?CrCl cannot be calculated (Patient's most recent lab result is older than the maximum 21 days allowed.). ? ?Past Medical History:  ?Diagnosis Date  ? CAD (coronary artery disease)   ? a. 06/2014 NSTEMI/PCI: LM nl, LAD 32mw/ mobile thrombus (aspiration thrombectomy & 2.75x28 Promus DES), LCX mild dzs mid, RCA mild dzs. EF 60%;  b. 09/2017 MV: EF 69%, no ischemia/infarct.  ? High cholesterol   ? Hx of echocardiogram   ? Echo (9/15):  EF 55-60%, no RWMA, Gr 1 DD, mild BAE  ? Hypertension   ? ? ?Current Outpatient Medications on File Prior to Visit  ?Medication Sig Dispense Refill  ? aspirin EC 81 MG EC tablet Take 1 tablet (81 mg total) by mouth daily. Swallow whole.    ? carvedilol (COREG) 12.5 MG tablet Take 1 tablet (12.5 mg total) by mouth 2 (two) times daily. 180 tablet 3  ? dapsone 100 MG tablet Take 50 mg by mouth daily.    ? ezetimibe (ZETIA) 10 MG tablet Take 10 mg by mouth daily.    ? hydrochlorothiazide (MICROZIDE) 12.5 MG capsule Take 1 capsule (12.5 mg total) by mouth daily. 30 capsule 5  ? nitroGLYCERIN (NITROSTAT) 0.4 MG SL tablet Place 1 tablet (0.4 mg total) under the tongue every 5 (five) minutes x 3 doses as needed. For chest pain 25 tablet 0  ? Omega-3 Fatty Acids (FISH OIL PO) Take 1,000 mg by mouth daily.     ? rosuvastatin (CRESTOR) 10 MG tablet Take 1 tablet (10 mg total) by mouth daily. 90 tablet 3  ? telmisartan (MICARDIS) 80 MG tablet Take 1 tablet (80 mg total) by mouth daily. 90 tablet 3  ? ?No current facility-administered medications on file prior to visit.  ? ? ?Allergies  ?Allergen Reactions  ? Ciprofloxacin Rash  ? Flagyl [Metronidazole] Rash  ? ? ?Assessment/Plan: ? ?1. Hypertension - BP elevated in clinic although pt has white coat HTN. Home BP readings generally elevated as well. Had been targeting BP goal < 130/831mg however ok with readings running a little higher given occasional dizziness after standing that he's been experiencing for the past few years. Will  increase carvedilol to '25mg'$  BID. Checking BMET today with recent med changes. Continue telmisartan '80mg'$  daily and HCTZ 12.'5mg'$  daily. Pt will continue wearing compression stockings and was encouraged to stay hydrated with water. Will call pt in 3-4 weeks to follow up with home BP readings. If possible, will plan to stop low dose thiazide. ? ?Rasheed Welty E. Terree Gaultney, PharmD, BCACP, CPP ?[id number] [address]Phone: [phone]: ([phone]/12/2021 7:58 AM ? ? ?

## 2022-03-01 ENCOUNTER — Ambulatory Visit (INDEPENDENT_AMBULATORY_CARE_PROVIDER_SITE_OTHER): Payer: 59 | Admitting: Pharmacist

## 2022-03-01 VITALS — BP 150/78 | HR 86

## 2022-03-01 DIAGNOSIS — I10 Essential (primary) hypertension: Secondary | ICD-10-CM | POA: Diagnosis not present

## 2022-03-01 LAB — BASIC METABOLIC PANEL
BUN/Creatinine Ratio: 14 (ref 9–20)
BUN: 12 mg/dL (ref 6–24)
CO2: 23 mmol/L (ref 20–29)
Calcium: 8.7 mg/dL (ref 8.7–10.2)
Chloride: 102 mmol/L (ref 96–106)
Creatinine, Ser: 0.88 mg/dL (ref 0.76–1.27)
Glucose: 186 mg/dL — ABNORMAL HIGH (ref 70–99)
Potassium: 3.7 mmol/L (ref 3.5–5.2)
Sodium: 138 mmol/L (ref 134–144)
eGFR: 99 mL/min/{1.73_m2} (ref >59)

## 2022-03-01 MED ORDER — CARVEDILOL 25 MG PO TABS
25.0000 mg | ORAL_TABLET | Freq: Two times a day (BID) | ORAL | 3 refills | Status: AC
Start: 2022-03-01 — End: ?

## 2022-03-01 NOTE — Patient Instructions (Signed)
Your blood pressure goal is < 130/26mHg ? ?Increase your carvedilol from 12.'5mg'$  to '25mg'$  twice daily ? ?Move your telmisartan to the evening ? ?Continue taking your other medications ? ?Stay hydrated with water and take your time when you go from sitting to standing ? ?Monitor your blood pressure at home ? ?I'll call you in 3-4 weeks to touch base and see how your readings are looking ? ?

## 2022-03-22 ENCOUNTER — Telehealth: Payer: Self-pay | Admitting: Pharmacist

## 2022-03-22 NOTE — Telephone Encounter (Signed)
Called pt to follow up with BP readings since increasing carvedilol dose earlier this month and moving his telmisartan dose to the evening. Pt reports BP readings much improved. Systolic typically 953-967. Had a low systolic of 289 and a high of 145 but these were outliers. He'll continue on same meds and was advised to call clinic if his systolic is > 791 on a regular basis, could increase his dose of HCTZ if needed.

## 2022-05-30 ENCOUNTER — Encounter: Payer: Self-pay | Admitting: Interventional Cardiology

## 2022-05-31 ENCOUNTER — Encounter: Payer: Self-pay | Admitting: *Deleted

## 2022-05-31 NOTE — Telephone Encounter (Signed)
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  Letter sent to patient through my chart.

## 2022-10-26 ENCOUNTER — Encounter: Payer: Self-pay | Admitting: Interventional Cardiology

## 2023-05-08 ENCOUNTER — Other Ambulatory Visit: Payer: Self-pay | Admitting: Physician Assistant

## 2023-05-08 DIAGNOSIS — Z87891 Personal history of nicotine dependence: Secondary | ICD-10-CM

## 2023-06-14 ENCOUNTER — Ambulatory Visit: Payer: Managed Care, Other (non HMO)

## 2023-07-05 ENCOUNTER — Ambulatory Visit: Payer: Managed Care, Other (non HMO)

## 2023-07-05 DIAGNOSIS — Z87891 Personal history of nicotine dependence: Secondary | ICD-10-CM | POA: Diagnosis not present

## 2023-12-24 ENCOUNTER — Other Ambulatory Visit: Payer: Self-pay

## 2023-12-24 MED ORDER — OZEMPIC (0.25 OR 0.5 MG/DOSE) 2 MG/3ML ~~LOC~~ SOPN
0.5000 mg | PEN_INJECTOR | SUBCUTANEOUS | 0 refills | Status: DC
  Filled 2023-12-24: qty 3, 28d supply, fill #0

## 2023-12-25 ENCOUNTER — Other Ambulatory Visit: Payer: Self-pay

## 2024-01-18 ENCOUNTER — Other Ambulatory Visit: Payer: Self-pay

## 2024-01-20 ENCOUNTER — Other Ambulatory Visit: Payer: Self-pay

## 2024-01-20 MED ORDER — OZEMPIC (0.25 OR 0.5 MG/DOSE) 2 MG/3ML ~~LOC~~ SOPN
0.5000 mg | PEN_INJECTOR | SUBCUTANEOUS | 2 refills | Status: DC
  Filled 2024-01-20: qty 3, 28d supply, fill #0

## 2024-01-21 ENCOUNTER — Other Ambulatory Visit: Payer: Self-pay

## 2024-01-30 ENCOUNTER — Other Ambulatory Visit: Payer: Self-pay

## 2024-02-13 ENCOUNTER — Other Ambulatory Visit: Payer: Self-pay

## 2024-02-13 MED ORDER — AMOXICILLIN 875 MG PO TABS
875.0000 mg | ORAL_TABLET | Freq: Two times a day (BID) | ORAL | 0 refills | Status: AC
  Filled 2024-02-13: qty 20, 10d supply, fill #0

## 2024-02-14 ENCOUNTER — Other Ambulatory Visit: Payer: Self-pay

## 2024-02-18 ENCOUNTER — Other Ambulatory Visit: Payer: Self-pay

## 2024-02-18 MED ORDER — OZEMPIC (1 MG/DOSE) 4 MG/3ML ~~LOC~~ SOPN
1.0000 mg | PEN_INJECTOR | SUBCUTANEOUS | 2 refills | Status: DC
  Filled 2024-02-18: qty 3, 28d supply, fill #0
  Filled 2024-03-15: qty 3, 28d supply, fill #1
  Filled 2024-04-12: qty 3, 28d supply, fill #2

## 2024-02-21 ENCOUNTER — Other Ambulatory Visit: Payer: Self-pay

## 2024-03-19 ENCOUNTER — Other Ambulatory Visit: Payer: Self-pay

## 2024-03-26 ENCOUNTER — Other Ambulatory Visit: Payer: Self-pay

## 2024-04-03 ENCOUNTER — Other Ambulatory Visit: Payer: Self-pay

## 2024-04-03 MED ORDER — DAPSONE 25 MG PO TABS
50.0000 mg | ORAL_TABLET | Freq: Every day | ORAL | 11 refills | Status: AC
  Filled 2024-04-03 – 2024-04-30 (×2): qty 60, 30d supply, fill #0

## 2024-04-15 ENCOUNTER — Other Ambulatory Visit: Payer: Self-pay

## 2024-04-22 ENCOUNTER — Other Ambulatory Visit: Payer: Self-pay

## 2024-04-30 ENCOUNTER — Other Ambulatory Visit: Payer: Self-pay

## 2024-05-15 ENCOUNTER — Other Ambulatory Visit: Payer: Self-pay

## 2024-05-15 MED ORDER — OZEMPIC (1 MG/DOSE) 4 MG/3ML ~~LOC~~ SOPN
1.0000 mg | PEN_INJECTOR | SUBCUTANEOUS | 2 refills | Status: DC
  Filled 2024-05-15: qty 3, 28d supply, fill #0
  Filled 2024-06-12: qty 3, 28d supply, fill #1

## 2024-05-26 ENCOUNTER — Other Ambulatory Visit: Payer: Self-pay

## 2024-07-08 ENCOUNTER — Other Ambulatory Visit: Payer: Self-pay

## 2024-07-08 MED ORDER — OZEMPIC (2 MG/DOSE) 8 MG/3ML ~~LOC~~ SOPN
2.0000 mg | PEN_INJECTOR | SUBCUTANEOUS | 5 refills | Status: AC
  Filled 2024-07-08: qty 3, 28d supply, fill #0
  Filled 2024-08-03: qty 3, 28d supply, fill #1
  Filled 2024-09-01: qty 3, 28d supply, fill #2
  Filled 2024-09-29: qty 3, 28d supply, fill #3
  Filled 2024-10-26: qty 3, 28d supply, fill #4
  Filled 2024-11-24: qty 3, 28d supply, fill #5

## 2024-07-10 ENCOUNTER — Other Ambulatory Visit: Payer: Self-pay

## 2024-08-03 ENCOUNTER — Other Ambulatory Visit: Payer: Self-pay

## 2024-09-01 ENCOUNTER — Other Ambulatory Visit: Payer: Self-pay

## 2024-09-01 MED ORDER — ORPHENADRINE CITRATE ER 100 MG PO TB12
100.0000 mg | ORAL_TABLET | Freq: Two times a day (BID) | ORAL | 0 refills | Status: AC | PRN
  Filled 2024-09-01: qty 60, 30d supply, fill #0

## 2024-09-01 MED ORDER — METHYLPREDNISOLONE 4 MG PO TBPK
ORAL_TABLET | ORAL | 0 refills | Status: AC
  Filled 2024-09-01: qty 21, 6d supply, fill #0

## 2024-09-02 ENCOUNTER — Other Ambulatory Visit: Payer: Self-pay

## 2024-09-30 ENCOUNTER — Other Ambulatory Visit: Payer: Self-pay

## 2024-10-26 ENCOUNTER — Other Ambulatory Visit: Payer: Self-pay

## 2024-11-25 ENCOUNTER — Other Ambulatory Visit: Payer: Self-pay
